# Patient Record
Sex: Female | Born: 1985 | Race: White | Hispanic: No | Marital: Married | Smoking: Never smoker
Health system: Southern US, Community
[De-identification: ages and names within clinical notes are randomized; demographics above are authoritative.]

## PROBLEM LIST (undated history)

## (undated) DIAGNOSIS — G43909 Migraine, unspecified, not intractable, without status migrainosus: Secondary | ICD-10-CM

## (undated) DIAGNOSIS — K589 Irritable bowel syndrome without diarrhea: Secondary | ICD-10-CM

## (undated) DIAGNOSIS — F32A Depression, unspecified: Secondary | ICD-10-CM

## (undated) DIAGNOSIS — G932 Benign intracranial hypertension: Secondary | ICD-10-CM

## (undated) DIAGNOSIS — K219 Gastro-esophageal reflux disease without esophagitis: Secondary | ICD-10-CM

## (undated) DIAGNOSIS — F419 Anxiety disorder, unspecified: Secondary | ICD-10-CM

## (undated) DIAGNOSIS — E785 Hyperlipidemia, unspecified: Secondary | ICD-10-CM

## (undated) HISTORY — PX: CHOLECYSTECTOMY: SHX55

## (undated) HISTORY — PX: WISDOM TOOTH EXTRACTION: SHX21

## (undated) HISTORY — PX: TONSILLECTOMY: SUR1361

## (undated) HISTORY — PX: ABDOMINAL HYSTERECTOMY: SHX81

---

## 2010-09-06 ENCOUNTER — Encounter: Admission: RE | Admit: 2010-09-06 | Discharge: 2010-09-06 | Payer: Self-pay | Admitting: Unknown Physician Specialty

## 2016-04-07 ENCOUNTER — Encounter: Payer: Self-pay | Admitting: *Deleted

## 2016-04-07 ENCOUNTER — Emergency Department
Admission: EM | Admit: 2016-04-07 | Discharge: 2016-04-07 | Disposition: A | Discharge status: Home / Self Care | Payer: BLUE CROSS/BLUE SHIELD | Source: Home / Self Care | Attending: Emergency Medicine | Admitting: Emergency Medicine

## 2016-04-07 DIAGNOSIS — G4459 Other complicated headache syndrome: Secondary | ICD-10-CM

## 2016-04-07 HISTORY — DX: Hyperlipidemia, unspecified: E78.5

## 2016-04-07 HISTORY — DX: Benign intracranial hypertension: G93.2

## 2016-04-07 MED ORDER — PROMETHAZINE HCL 25 MG PO TABS: 25.0000 mg | ORAL_TABLET | Freq: Four times a day (QID) | ORAL | Status: AC | PRN

## 2016-04-07 MED ORDER — PROMETHAZINE HCL 25 MG PO TABS
25.0000 mg | ORAL_TABLET | Freq: Four times a day (QID) | ORAL | Status: DC | PRN
  Administered 2016-04-07: 25 mg via ORAL

## 2016-04-07 MED ORDER — KETOROLAC TROMETHAMINE 60 MG/2ML IM SOLN
60.0000 mg | Freq: Once | INTRAMUSCULAR | Status: AC
  Administered 2016-04-07: 60 mg via INTRAMUSCULAR

## 2016-04-07 NOTE — ED Provider Notes (Signed)
CSN: [id number]     Arrival date & time 04/07/16  1411 History   First MD Initiated Contact with Patient 04/07/16 1506     Chief Complaint  Patient presents with  . Migraine   (Consider location/radiation/quality/duration/timing/severity/associated sxs/prior Treatment) Patient is a 30 y.o. female presenting with migraines. The history is provided by the patient. No language interpreter was used.  Migraine This is a recurrent problem. The current episode started more than 1 week ago. The problem occurs constantly. The problem has been gradually worsening. Pertinent negatives include no headaches and no shortness of breath. Nothing aggravates the symptoms. Nothing relieves the symptoms. She has tried nothing for the symptoms. The treatment provided no relief.  Pt reports she has a history of pseudotumor cerebri. Pt reports headaches are normal.  Pt reports she has felt hot and flushed.  Pt complains of feeling shaky and nervous.  Pt went to ED and had migraine cocktail with some relief.  Pt reports she is worried about thyroid problems. Past Medical History  Diagnosis Date  . Hyperlipidemia   . Pseudotumor cerebri    Past Surgical History  Procedure Laterality Date  . Tonsillectomy    . Wisdom tooth extraction     History reviewed. No pertinent family history. Social History  Substance Use Topics  . Smoking status: Never Smoker   . Smokeless tobacco: None  . Alcohol Use: No   OB History    No data available     Review of Systems  Respiratory: Negative for shortness of breath.   Neurological: Negative for headaches.  All other systems reviewed and are negative.   Allergies  Penicillins  Home Medications   Prior to Admission medications   Medication Sig Start Date End Date Taking? Authorizing Provider  FLUoxetine (PROZAC) 40 MG capsule Take 40 mg by mouth daily.   Yes Historical Provider, MD  furosemide (LASIX) 40 MG tablet Take 40 mg by mouth.   Yes Historical Provider, MD   LORazepam (ATIVAN) 0.5 MG tablet Take 0.5 mg by mouth every 8 (eight) hours.   Yes Historical Provider, MD   Meds Ordered and Administered this Visit   Medications  ketorolac (TORADOL) injection 60 mg (60 mg Intramuscular Given 04/07/16 1507)    BP 125/88 mmHg  Pulse 90  Temp(Src) 98.4 F (36.9 C) (Oral)  Resp 16  Ht  (1.778 m)  Wt 205 lb (92.987 kg)  BMI 29.41 kg/m2  SpO2 97%  LMP 03/08/2016 No data found.   Physical Exam  Constitutional: She is oriented to person, place, and time. She appears well-developed and well-nourished.  HENT:  Head: Normocephalic and atraumatic.  Right Ear: External ear normal.  Left Ear: External ear normal.  Nose: Nose normal.  Mouth/Throat: Oropharynx is clear and moist.  Eyes: Conjunctivae and EOM are normal. Pupils are equal, round, and reactive to light.  Neck: Normal range of motion.  Cardiovascular: Normal rate and normal heart sounds.   Pulmonary/Chest: Effort normal and breath sounds normal.  Abdominal: Soft. She exhibits no distension.  Musculoskeletal: Normal range of motion.  Neurological: She is alert and oriented to person, place, and time.  Skin: Skin is warm.  Psychiatric: She has a normal mood and affect.  Nursing note and vitals reviewed.   ED Course  Procedures (including critical care time)  Labs Review Labs Reviewed  CBC WITH DIFFERENTIAL/PLATELET  COMPREHENSIVE METABOLIC PANEL  TSH    Imaging Review No results found.   Visual Acuity Review  Right Eye  Distance:   Left Eye Distance:   IBilateral Distance:    Right Eye Near:   Left Eye Near:    Bilateral Near:         MDM  Pt given torodol Im and phenergan po.  Pt has normal exam.   I sill send labs due to pt's concern about thyroid disease and her hot flashes and shaking. I will send labs.  Pt is advised results will not return until later today/tomorrow.  Pt is advised to follow up with her neurologist.  Labs reviewed.  Normal tsh.     1.  Other complicated headache syndrome    An After Visit Summary was printed and given to the patient. Meds ordered this encounter  Medications  . furosemide (LASIX) 40 MG tablet    Sig: Take 40 mg by mouth.  Marland Kitchen. FLUoxetine (PROZAC) 40 MG capsule    Sig: Take 40 mg by mouth daily.  Marland Kitchen. LORazepam (ATIVAN) 0.5 MG tablet    Sig: Take 0.5 mg by mouth every 8 (eight) hours.  Marland Kitchen. DISCONTD: promethazine (PHENERGAN) tablet 25 mg    Sig:   . ketorolac (TORADOL) injection 60 mg    Sig:   . promethazine (PHENERGAN) 25 MG tablet    Sig: Take 1 tablet (25 mg total) by mouth every 6 (six) hours as needed for nausea or vomiting.    Dispense:  20 tablet    Refill:  0    Order Specific Question:  Supervising Provider    Answer:  Georgina PillionMASSEY, DAVID [[id number]]    Elson AreasLeslie K Shayley Medlin, PA-C 04/07/16 [address]1535  Deisha Stull K Pierrepont ManorSofia, [place] JerseyPA-C 04/08/16 [phone]

## 2016-04-07 NOTE — ED Notes (Signed)
Pt c/o migraine with nausea x 8 days. She went to the ED on 04/02/2016. Reports still having HA's everyday. She has a hx of Pseudotumor cerebri.

## 2016-04-07 NOTE — Discharge Instructions (Signed)

## 2016-04-08 ENCOUNTER — Telehealth: Payer: Self-pay | Admitting: *Deleted

## 2016-04-08 LAB — CBC WITH DIFFERENTIAL/PLATELET
Basophils Absolute: 0 cells/uL (ref 0–200)
Basophils Relative: 0 %
EOS PCT: 3 %
Eosinophils Absolute: 324 cells/uL (ref 15–500)
HCT: 47.3 % — ABNORMAL HIGH (ref 35.0–45.0)
HEMOGLOBIN: 15.9 g/dL — AB (ref 11.7–15.5)
LYMPHS ABS: 3456 {cells}/uL (ref 850–3900)
LYMPHS PCT: 32 %
MCH: 30.8 pg (ref 27.0–33.0)
MCHC: 33.6 g/dL (ref 32.0–36.0)
MCV: 91.7 fL (ref 80.0–100.0)
MONOS PCT: 5 %
MPV: 10.1 fL (ref 7.5–12.5)
Monocytes Absolute: 540 cells/uL (ref 200–950)
NEUTROS PCT: 60 %
Neutro Abs: 6480 cells/uL (ref 1500–7800)
PLATELETS: 371 10*3/uL (ref 140–400)
RBC: 5.16 MIL/uL — AB (ref 3.80–5.10)
RDW: 13.5 % (ref 11.0–15.0)
WBC: 10.8 10*3/uL (ref 3.8–10.8)

## 2016-04-08 LAB — COMPREHENSIVE METABOLIC PANEL
ALBUMIN: 4.6 g/dL (ref 3.6–5.1)
ALK PHOS: 79 U/L (ref 33–115)
ALT: 16 U/L (ref 6–29)
AST: 12 U/L (ref 10–30)
BUN: 15 mg/dL (ref 7–25)
CALCIUM: 10 mg/dL (ref 8.6–10.2)
CHLORIDE: 101 mmol/L (ref 98–110)
CO2: 24 mmol/L (ref 20–31)
Creat: 0.8 mg/dL (ref 0.50–1.10)
Glucose, Bld: 79 mg/dL (ref 65–99)
POTASSIUM: 4.4 mmol/L (ref 3.5–5.3)
SODIUM: 137 mmol/L (ref 135–146)
TOTAL PROTEIN: 7.2 g/dL (ref 6.1–8.1)
Total Bilirubin: 0.4 mg/dL (ref 0.2–1.2)

## 2016-04-08 LAB — TSH: TSH: 3.33 m[IU]/L

## 2016-06-22 ENCOUNTER — Encounter: Payer: Self-pay | Admitting: Emergency Medicine

## 2016-06-22 ENCOUNTER — Emergency Department
Admission: EM | Admit: 2016-06-22 | Discharge: 2016-06-22 | Disposition: A | Discharge status: Home / Self Care | Payer: BLUE CROSS/BLUE SHIELD | Source: Home / Self Care | Attending: Family Medicine | Admitting: Family Medicine

## 2016-06-22 DIAGNOSIS — R51 Headache: Secondary | ICD-10-CM

## 2016-06-22 DIAGNOSIS — R519 Headache, unspecified: Secondary | ICD-10-CM

## 2016-06-22 MED ORDER — KETOROLAC TROMETHAMINE 60 MG/2ML IM SOLN
60.0000 mg | Freq: Once | INTRAMUSCULAR | Status: AC
  Administered 2016-06-22: 60 mg via INTRAMUSCULAR

## 2016-06-22 MED ORDER — FLUTICASONE PROPIONATE 50 MCG/ACT NA SUSP: 2.0000 | Freq: Every day | NASAL | Status: AC

## 2016-06-22 MED ORDER — NAPROXEN 500 MG PO TABS: 500.0000 mg | ORAL_TABLET | Freq: Two times a day (BID) | ORAL | Status: AC

## 2016-06-22 NOTE — ED Provider Notes (Signed)
CSN: [id number]     Arrival date & time 06/22/16  1752 History   First MD Initiated Contact with Patient 06/22/16 1800     Chief Complaint  Patient presents with  . Headache   (Consider location/radiation/quality/duration/timing/severity/associated sxs/prior Treatment) HPI Larry SierrasLauren Faulkner is a 30 y.o. female presenting to UC with hx of pseudotumor cerebri and migraines, [facility] 3 days of gradually worsening frontal headache pt concerned is a sinus infection. Pain is aching and sore across bridge of her nose and behind her eyes, 8/10 at worst. She has been taking her migraine medications- Maxalt and Propranonol w/o relief.  She has also stayed home from work and rested in a dark room w/o relief.  She has f/u with her eye doctor and PCP. her PCP prescribed her prednisone, which she took first dose earlier today.  She notes eye doctor checked the pressure in her eyes and it was normal.  She has not taken acetaminophen, as she states "I'm not allowed to take caffeine because my pseudotumor," or ibuprofen. Denies nausea, fever, chills, or congestion.  Denies recent head trauma. Denies change in vision, neck pain or stiffness.    Past Medical History  Diagnosis Date  . Hyperlipidemia   . Pseudotumor cerebri    Past Surgical History  Procedure Laterality Date  . Tonsillectomy    . Wisdom tooth extraction     History reviewed. No pertinent family history. Social History  Substance Use Topics  . Smoking status: Never Smoker   . Smokeless tobacco: None  . Alcohol Use: No   OB History    No data available     Review of Systems  Constitutional: Negative for fever and chills.  HENT: Positive for sinus pressure. Negative for congestion, ear pain, rhinorrhea, sore throat, trouble swallowing and voice change.   Eyes: Positive for pain ("headache behind eyes"). Negative for photophobia.  Respiratory: Negative for cough and shortness of breath.   Cardiovascular: Negative for  chest pain and palpitations.  Gastrointestinal: Negative for nausea, vomiting, abdominal pain and diarrhea.  Musculoskeletal: Negative for myalgias, back pain and arthralgias.  Skin: Negative for rash.  Neurological: Positive for headaches. Negative for dizziness and light-headedness.    Allergies  Penicillins  Home Medications   Prior to Admission medications   Medication Sig Start Date End Date Taking? Authorizing Provider  FLUoxetine (PROZAC) 40 MG capsule Take 40 mg by mouth daily.    Historical Provider, MD  fluticasone (FLONASE) 50 MCG/ACT nasal spray Place 2 sprays into both nostrils daily. For at least 2 weeks, then daily as needed 06/22/16   Junius FinnerErin O'Malley, PA-C  furosemide (LASIX) 40 MG tablet Take 40 mg by mouth.    Historical Provider, MD  LORazepam (ATIVAN) 0.5 MG tablet Take 0.5 mg by mouth every 8 (eight) hours.    Historical Provider, MD  naproxen (NAPROSYN) 500 MG tablet Take 1 tablet (500 mg total) by mouth 2 (two) times daily. 06/22/16   Junius FinnerErin O'Malley, PA-C  promethazine (PHENERGAN) 25 MG tablet Take 1 tablet (25 mg total) by mouth every 6 (six) hours as needed for nausea or vomiting. 04/07/16   Elson AreasLeslie K Sofia, PA-C   Meds Ordered and Administered this Visit   Medications  ketorolac (TORADOL) injection 60 mg (60 mg Intramuscular Given 06/22/16 1813)    BP 112/79 mmHg  Pulse 78  Temp(Src) 97.8 F (36.6 C) (Oral)  Wt 209 lb (94.802 kg)  SpO2 98%  LMP 06/13/2016 No data found.  Physical Exam  Constitutional: She is oriented to person, place, and time. She appears well-developed and well-nourished. No distress.  HENT:  Head: Normocephalic and atraumatic.  Right Ear: Tympanic membrane normal.  Left Ear: Tympanic membrane normal.  Nose: Nose normal.  Mouth/Throat: Uvula is midline, oropharynx is clear and moist and mucous membranes are normal.  Eyes: EOM are normal. Pupils are equal, round, and reactive to light.  Neck: Normal range of motion.  Cardiovascular:  Normal rate, regular rhythm and normal heart sounds.   Pulmonary/Chest: Effort normal and breath sounds normal. No respiratory distress.  Musculoskeletal: Normal range of motion.  Neurological: She is alert and oriented to person, place, and time. She has normal strength. No cranial nerve deficit or sensory deficit. Coordination and gait normal. GCS eye subscore is 4. GCS verbal subscore is 5. GCS motor subscore is 6.  Skin: Skin is warm and dry.  Psychiatric: She has a normal mood and affect. Her behavior is normal.  Nursing note and vitals reviewed.   ED Course  Procedures (including critical care time)  Labs Review Labs Reviewed - No data to display  Imaging Review No results found.    MDM   1. Sinus headache    Pt c/o sinus headache. She already had eye pressure checked earlier today. Started on prednisone earlier today. Denies nausea. Normal neuro exam. Denies fever or nasal congestion. Doubt sinus infection at this time.  Tx: Toradol  IM Rx: Naproxen and Flonase (to use for at least 2 weeks, then daily as needed) Encouraged f/u with PCP if not improving in 2-3 days. Discussed symptoms that warrant emergent care in the ED. Patient verbalized understanding and agreement with treatment plan.     Junius Finner, PA-C 06/22/16 1821

## 2016-06-22 NOTE — ED Notes (Signed)
Pt c/o facial pain and HA x3 days. Migraine meds not helping.

## 2016-07-01 ENCOUNTER — Emergency Department
Admission: EM | Admit: 2016-07-01 | Discharge: 2016-07-01 | Disposition: A | Discharge status: Other Acute Inpatient Hospital | Payer: BLUE CROSS/BLUE SHIELD | Source: Home / Self Care | Attending: Family Medicine | Admitting: Family Medicine

## 2016-07-01 ENCOUNTER — Encounter: Payer: Self-pay | Admitting: Emergency Medicine

## 2016-07-01 DIAGNOSIS — R110 Nausea: Secondary | ICD-10-CM

## 2016-07-01 DIAGNOSIS — R1010 Upper abdominal pain, unspecified: Secondary | ICD-10-CM | POA: Diagnosis not present

## 2016-07-01 MED ORDER — PROMETHAZINE HCL 25 MG PO TABS
25.0000 mg | ORAL_TABLET | Freq: Once | ORAL | Status: AC
  Administered 2016-07-01: 25 mg via ORAL

## 2016-07-01 NOTE — ED Provider Notes (Signed)
CSN: [id number]     Arrival date & time 07/01/16  1413 History   First MD Initiated Contact with Patient 07/01/16 1444     Chief Complaint  Patient presents with  . Abdominal Pain   (Consider location/radiation/quality/duration/timing/severity/associated sxs/prior Treatment) HPI  Michele Day is a 30 y.o. female presenting to UC with c/o epigastric and upper abdominal pain that started last night, waxes and wanes throughout the day, 3/10 at worst, cramping and occasionally sharp. She has associated nausea, dry mouth, and mildly lightheaded.  Pt notes it feels like a hunger pain but states she has had a large sandwich and drink for lunch but still feels "hungry."  She notes a co-worker caledl out yesterday due to vomiting but she also notes she had an increase in her lasix medication for her pseudotumor cerebri and wonders if that is the cause of her symptoms.  Denies vomiting or diarrhea. Hx of acid reflux as a teenager and notes she has had increased stress recently.  Denies hx of abdominal surgeries. No urinary symptoms. Denies back or chest pain.    Past Medical History:  Diagnosis Date  . Hyperlipidemia   . Pseudotumor cerebri    Past Surgical History:  Procedure Laterality Date  . TONSILLECTOMY    . WISDOM TOOTH EXTRACTION     History reviewed. No pertinent family history. Social History  Substance Use Topics  . Smoking status: Never Smoker  . Smokeless tobacco: Never Used  . Alcohol use No   OB History    No data available     Review of Systems  Constitutional: Negative for appetite change, chills, diaphoresis, fatigue and fever.  Respiratory: Negative for cough and shortness of breath.   Cardiovascular: Negative for chest pain, palpitations and leg swelling.  Gastrointestinal: Positive for abdominal pain and nausea. Negative for constipation, diarrhea and vomiting.  Genitourinary: Negative for dysuria, flank pain, frequency and hematuria.  Musculoskeletal: Negative  for arthralgias and myalgias.  Neurological: Positive for light-headedness. Negative for dizziness and headaches.    Allergies  Penicillins  Home Medications   Prior to Admission medications   Medication Sig Start Date End Date Taking? Authorizing Provider  FLUoxetine (PROZAC) 40 MG capsule Take 40 mg by mouth daily.    Historical Provider, MD  fluticasone (FLONASE) 50 MCG/ACT nasal spray Place 2 sprays into both nostrils daily. For at least 2 weeks, then daily as needed 06/22/16   Junius Finner, PA-C  furosemide (LASIX) 40 MG tablet Take 40 mg by mouth 2 (two) times daily.     Historical Provider, MD  LORazepam (ATIVAN) 0.5 MG tablet Take 0.5 mg by mouth every 8 (eight) hours.    Historical Provider, MD  naproxen (NAPROSYN) 500 MG tablet Take 1 tablet (500 mg total) by mouth 2 (two) times daily. 06/22/16   Junius Finner, PA-C  promethazine (PHENERGAN) 25 MG tablet Take 1 tablet (25 mg total) by mouth every 6 (six) hours as needed for nausea or vomiting. 04/07/16   Elson Areas, PA-C   Meds Ordered and Administered this Visit   Medications  promethazine (PHENERGAN) tablet 25 mg (25 mg Oral Given 07/01/16 1453)    BP 109/75 (BP Location: Left Arm)   Pulse 63   Temp 98.1 F (36.7 C) (Oral)   Resp 16   Ht 5\' 10"  (1.778 m)   Wt 210 lb (95.3 kg)   LMP 06/14/2016 (Exact Date)   SpO2 96%   BMI 30.13 kg/m  Orthostatic VS for the past 24  hrs:  BP- Lying Pulse- Lying BP- Sitting Pulse- Sitting BP- Standing at 0 minutes Pulse- Standing at 0 minutes  07/01/16 1512 113/75 68 111/77 68 110/77 64    Physical Exam  Constitutional: She is oriented to person, place, and time. She appears well-developed and well-nourished.  Pt sitting comfortably on exam bed, NAD  HENT:  Head: Normocephalic and atraumatic.  Eyes: EOM are normal. Pupils are equal, round, and reactive to light.  Neck: Normal range of motion.  Cardiovascular: Normal rate and regular rhythm.   Pulmonary/Chest: Effort normal. No  respiratory distress. She has no wheezes. She has no rales.  Abdominal: Soft. Bowel sounds are normal. She exhibits no distension and no mass. There is tenderness. There is no rebound and no guarding. No hernia.  Musculoskeletal: Normal range of motion.  Neurological: She is alert and oriented to person, place, and time.  Skin: Skin is warm and dry.  Psychiatric: She has a normal mood and affect. Her behavior is normal.  Nursing note and vitals reviewed.   Urgent Care Course   Clinical Course    Procedures (including critical care time)  Labs Review Labs Reviewed - No data to display  Imaging Review No results found.   MDM   1. Pain of upper abdomen   2. Nausea    Pt c/o upper abdominal pain worse in epigastrium and LUQ.  Tenderness on exam w/o rebound, guarding or masses. Pt recently increased dose of Lasix but also notes a co-worked called out b/c of vomiting yesterday.  Pt has also been under a lot of stress recently.  DDx: pancreatitis or GI upset from lasix increase, cholelithiasis (low concern for cholecystitis due to lack of fever or vomiting), viral gastritis or gastritis from acid reflux/ulcers.  Pt given Phenergan in UC. No vomiting while in UC. Moist mucous membranes. Normal vitals. Attempted to obtain blood labs, however, nursing staff unable to obtain blood.  Discussed symptomatic treatment, watch/wait vs going to emergency department this afternoon for further evaluation. Pt plans to go to emergency department for stat labs and potential imaging. Pt stable for discharge and transport to ED via POV with family member.     Junius Finner, PA-C 07/01/16 1525

## 2016-07-01 NOTE — ED Triage Notes (Signed)
Patient reports increasing her Lasix dose for past week per neurologist's instructions; last night she began feeling nauseated, dry mouth, light headed and having epigastric pain bilaterally.

## 2017-06-09 ENCOUNTER — Emergency Department (HOSPITAL_COMMUNITY): Payer: BLUE CROSS/BLUE SHIELD

## 2017-06-09 ENCOUNTER — Emergency Department (HOSPITAL_COMMUNITY)
Admission: EM | Admit: 2017-06-09 | Discharge: 2017-06-10 | Disposition: A | Discharge status: Home / Self Care | Payer: BLUE CROSS/BLUE SHIELD | Attending: Emergency Medicine | Admitting: Emergency Medicine

## 2017-06-09 ENCOUNTER — Encounter (HOSPITAL_COMMUNITY): Payer: Self-pay | Admitting: Emergency Medicine

## 2017-06-09 DIAGNOSIS — R1010 Upper abdominal pain, unspecified: Secondary | ICD-10-CM

## 2017-06-09 DIAGNOSIS — R197 Diarrhea, unspecified: Secondary | ICD-10-CM

## 2017-06-09 DIAGNOSIS — R110 Nausea: Secondary | ICD-10-CM

## 2017-06-09 LAB — COMPREHENSIVE METABOLIC PANEL
ALK PHOS: 38 U/L (ref 38–126)
ALT: 18 U/L (ref 14–54)
AST: 16 U/L (ref 15–41)
Albumin: 2.1 g/dL — ABNORMAL LOW (ref 3.5–5.0)
Anion gap: 7 (ref 5–15)
BILIRUBIN TOTAL: 0.4 mg/dL (ref 0.3–1.2)
CALCIUM: 4.9 mg/dL — AB (ref 8.9–10.3)
CHLORIDE: 125 mmol/L — AB (ref 101–111)
CO2: 12 mmol/L — ABNORMAL LOW (ref 22–32)
CREATININE: 0.31 mg/dL — AB (ref 0.44–1.00)
Glucose, Bld: 59 mg/dL — ABNORMAL LOW (ref 65–99)
Potassium: 2.5 mmol/L — CL (ref 3.5–5.1)
Sodium: 144 mmol/L (ref 135–145)
TOTAL PROTEIN: 3.4 g/dL — AB (ref 6.5–8.1)

## 2017-06-09 LAB — URINALYSIS, ROUTINE W REFLEX MICROSCOPIC
Bacteria, UA: NONE SEEN
Bilirubin Urine: NEGATIVE
GLUCOSE, UA: NEGATIVE mg/dL
Ketones, ur: NEGATIVE mg/dL
Leukocytes, UA: NEGATIVE
Nitrite: NEGATIVE
PH: 7 (ref 5.0–8.0)
PROTEIN: NEGATIVE mg/dL
RBC / HPF: NONE SEEN RBC/hpf (ref 0–5)
SPECIFIC GRAVITY, URINE: 1.001 — AB (ref 1.005–1.030)
SQUAMOUS EPITHELIAL / LPF: NONE SEEN

## 2017-06-09 LAB — CBC WITH DIFFERENTIAL/PLATELET
BASOS ABS: 0 10*3/uL (ref 0.0–0.1)
Basophils Relative: 1 %
EOS PCT: 6 %
Eosinophils Absolute: 0.2 10*3/uL (ref 0.0–0.7)
HEMATOCRIT: 24.4 % — AB (ref 36.0–46.0)
Hemoglobin: 8.3 g/dL — ABNORMAL LOW (ref 12.0–15.0)
LYMPHS ABS: 1.3 10*3/uL (ref 0.7–4.0)
LYMPHS PCT: 32 %
MCH: 30.9 pg (ref 26.0–34.0)
MCHC: 34 g/dL (ref 30.0–36.0)
MCV: 90.7 fL (ref 78.0–100.0)
MONO ABS: 0.2 10*3/uL (ref 0.1–1.0)
Monocytes Relative: 6 %
NEUTROS ABS: 2.2 10*3/uL (ref 1.7–7.7)
Neutrophils Relative %: 55 %
PLATELETS: 151 10*3/uL (ref 150–400)
RBC: 2.69 MIL/uL — AB (ref 3.87–5.11)
RDW: 12.3 % (ref 11.5–15.5)
WBC: 4 10*3/uL (ref 4.0–10.5)

## 2017-06-09 LAB — LIPASE, BLOOD: LIPASE: 13 U/L (ref 11–51)

## 2017-06-09 MED ORDER — ONDANSETRON HCL 4 MG/2ML IJ SOLN
4.0000 mg | Freq: Once | INTRAMUSCULAR | Status: AC
  Administered 2017-06-09: 4 mg via INTRAVENOUS
  Filled 2017-06-09: qty 2

## 2017-06-09 MED ORDER — SODIUM CHLORIDE 0.9 % IV BOLUS (SEPSIS)
1000.0000 mL | Freq: Once | INTRAVENOUS | Status: AC
Start: 2017-06-09 — End: 2017-06-10
  Administered 2017-06-09: 1000 mL via INTRAVENOUS

## 2017-06-09 MED ORDER — LORAZEPAM 2 MG/ML IJ SOLN
1.0000 mg | Freq: Once | INTRAMUSCULAR | Status: AC
  Administered 2017-06-09: 1 mg via INTRAVENOUS
  Filled 2017-06-09: qty 1

## 2017-06-09 MED ORDER — MORPHINE SULFATE (PF) 2 MG/ML IV SOLN
4.0000 mg | Freq: Once | INTRAVENOUS | Status: AC
  Administered 2017-06-09: 4 mg via INTRAVENOUS
  Filled 2017-06-09: qty 2

## 2017-06-09 MED ORDER — POTASSIUM CHLORIDE CRYS ER 20 MEQ PO TBCR
40.0000 meq | EXTENDED_RELEASE_TABLET | Freq: Once | ORAL | Status: AC
  Administered 2017-06-10: 40 meq via ORAL
  Filled 2017-06-09: qty 2

## 2017-06-09 NOTE — ED Provider Notes (Signed)
[facility]-EMERGENCY DEPT Provider Note   CSN: [id number] Arrival date & time: 06/09/17  1914     History   Chief Complaint Chief Complaint  Patient presents with  . Allergic Reaction    HPI Michele Day is a 31 y.o. female with a PMHx of pseudotumor cerebri, IBS, nephrolithiasis, HLD, GERD, and bipolar disorder, who presents to the ED with a myriad of complaints, some of which are unrelated to each other. Patient is a difficult historian which complicates the evaluation just slightly, but ultimately after long history, reason for ED visit is able to be summarized. Essentially, patient states that on 06/05/17 she went to [facility] specialists (saw Dr. Yevonne Pax per chart review) for evaluation of some ongoing upper abdominal pain, nausea, and constipation that had been going on for the last several weeks (of note, she was in a psychiatric facility the week before and they didn't send her for eval at that time, told her to go see someone after she left, which is why she made the appt on 06/05/17 for eval/consult of her symptoms). She reports that they gave her "a colon cleanse and Dulcolax" and started her on Linzess, and obtained a CT scan of her abdomen with contrast which was done yesterday. She has not received the results of that CT scan. She reports that since starting the colon cleanse she has had diarrhea, worsening abdominal pain, ongoing nausea, and has developed chills. She is also having increased belching and increased urinary frequency today. She describes her abdominal pain as 8/10 intermittent squeezing upper abdominal pain that occasionally radiates to her back, worse with eating, and unimproved with Linzess. No other medications tried for her pain. She states the pain today is the same abd pain she was seen for at the GI doctor's office on 06/05/17, however it feels worse since the colon cleanse and starting Linzess.   Then around 6-7 PM today she started feeling shaky, panicky/anxious  feeling, and felt like her throat was dry so she was having trouble swallowing, and felt like she was having an anxiety attack; states that once she arrived here and has been given a warm blanket, the symptoms have resolved. (However this is why the triage report stated she was here for "allergic reaction"; pt denies that she had an allergic reaction, states she just had a panic attack). Of note, she was started on gabapentin and lamictal 1.5wks ago at the psychiatric center, and since then she's had "shocking feelings" in the back of her neck that come and go; however this was ongoing there and is NOT an acute complaint.   She last ate fish sandwich at ~2:30pm. Reports +NSAID use recently. Currently on menstrual cycle. No prior abd surgeries. She denies recent travel, sick contacts, suspicious food intake, or EtOH use.   She also denies fevers, face swelling, allergic reaction symptoms, ongoing shaking/difficulty swallowing sensation, CP, SOB, vomiting, constipation, obstipation, melena, hematochezia, hematuria, dysuria, vaginal discharge, myalgias, arthralgias, numbness, tingling, focal weakness, or any other complaints at this time.   Of note, chart review of GI notes in care everywhere reveals that they stated that she had been seen at the GI doctor's office in 2016, and then hadn't been back since then, until that day's visit; during that visit she was endorsing globus sensation x1 wk as well as upper abd pain/nausea and constipation after a bout of pain meds recently, but that it had resolved with senakot and dulcolax; and she also endorsed diarrhea symptoms during that visit, but  reported that they had resolved and then had return of constipation; she admitted that she had stopped her PPI about 54yr ago. They also report that she had an EGD in 2012 that showed gastritis/esophagitis. They recommended miralax colon cleanse and started linzess, and ordered an abd xray which was negative, and then it  appears they ordered an abd/pelv CT (although no mention of this in their notes).    The history is provided by the patient and medical records. No language interpreter was used.  Abdominal Pain   This is a recurrent problem. The current episode started more than 1 week ago. The problem occurs daily. The problem has been gradually worsening. The pain is associated with eating. The pain is located in the LUQ and epigastric region. Quality: squeezing. The pain is at a severity of 8/10. The pain is moderate. Associated symptoms include belching, diarrhea, nausea and frequency. Pertinent negatives include fever, flatus, hematochezia, melena, vomiting, constipation, dysuria, hematuria, arthralgias and myalgias. The symptoms are aggravated by eating. Nothing relieves the symptoms. Past workup includes GI consult and CT scan. Her past medical history is significant for gallstones, GERD and irritable bowel syndrome.  Diarrhea   Associated symptoms include abdominal pain and chills. Pertinent negatives include no vomiting, no arthralgias and no myalgias. Her past medical history is significant for irritable bowel syndrome.    Past Medical History:  Diagnosis Date  . Hyperlipidemia   . Pseudotumor cerebri     There are no active problems to display for this patient.   Past Surgical History:  Procedure Laterality Date  . TONSILLECTOMY    . WISDOM TOOTH EXTRACTION      OB History    No data available       Home Medications    Prior to Admission medications   Medication Sig Start Date End Date Taking? Authorizing Provider  bisacodyl (DULCOLAX) 5 MG EC tablet Take 10 mg by mouth once.   Yes [provider]  carisoprodol (SOMA) 350 MG tablet Take 350 mg by mouth at bedtime. 05/01/17  Yes [provider]  cyclobenzaprine (FLEXERIL) 10 MG tablet Take 5 mg by mouth daily as needed for spasms. 05/02/17  Yes [provider]  furosemide (LASIX) 40 MG tablet Take 40 mg by  mouth daily.    Yes [provider]  gabapentin (NEURONTIN) 100 MG capsule Take 100 mg by mouth 2 (two) times daily. 100 mg in am, 100 mg noon. 06/02/17  Yes [provider]  gabapentin (NEURONTIN) 300 MG capsule Take 300 mg by mouth at bedtime. 06/02/17  Yes [provider]  HYDROcodone-acetaminophen (NORCO/VICODIN) 5-325 MG tablet Take 1 tablet by mouth daily as needed for pain. 05/22/17  Yes [provider]  lamoTRIgine (LAMICTAL) 25 MG tablet Take 25 mg by mouth daily. 06/02/17  Yes [provider]  LINZESS 145 MCG CAPS capsule Take 145 mcg by mouth daily. 06/05/17  Yes [provider]  LORazepam (ATIVAN) 0.5 MG tablet Take 0.5 mg by mouth at bedtime.    Yes [provider]  medroxyPROGESTERone (PROVERA) 10 MG tablet Take 10 mg by mouth as directed. Take 10 mg 15-25 of each month 05/17/17  Yes [provider]  omeprazole (PRILOSEC) 40 MG capsule Take 40 mg by mouth daily. 06/04/17  Yes [provider]  polyethylene glycol powder (GLYCOLAX/MIRALAX) powder Take 17 g by mouth daily as needed for constipation. 06/05/17  Yes [provider]  propranolol ER (INDERAL LA) 80 MG 24 hr capsule  Take 80 mg by mouth daily. 06/07/17  Yes [provider]  rizatriptan (MAXALT) 10 MG tablet Take 10 mg by mouth as needed for migraine. 05/19/16  Yes [provider]  zolpidem (AMBIEN) 10 MG tablet Take 10 mg by mouth at bedtime. 06/02/17  Yes [provider]  fluticasone (FLONASE) 50 MCG/ACT nasal spray Place 2 sprays into both nostrils daily. For at least 2 weeks, then daily as needed Patient not taking: Reported on 06/09/2017 06/22/16   Noe Gens, PA-C  naproxen (NAPROSYN) 500 MG tablet Take 1 tablet (500 mg total) by mouth 2 (two) times daily. Patient not taking: Reported on 06/09/2017 06/22/16   Noe Gens, PA-C  promethazine (PHENERGAN) 25 MG tablet Take 1 tablet (25 mg total) by mouth every 6 (six) hours  as needed for nausea or vomiting. Patient not taking: Reported on 06/09/2017 04/07/16   Sidney Ace    Family History History reviewed. No pertinent family history.  Social History Social History  Substance Use Topics  . Smoking status: Never Smoker  . Smokeless tobacco: Never Used  . Alcohol use No     Allergies   Ciprofloxacin; Latex; Doxycycline; Duloxetine; and Penicillins   Review of Systems Review of Systems  Constitutional: Positive for chills. Negative for fever.  HENT: Negative for facial swelling and trouble swallowing (none ongoing).   Respiratory: Negative for shortness of breath.   Cardiovascular: Negative for chest pain.  Gastrointestinal: Positive for abdominal pain, diarrhea and nausea. Negative for blood in stool, constipation, flatus, hematochezia, melena and vomiting.       +belching  Genitourinary: Positive for frequency. Negative for dysuria, hematuria and vaginal discharge.  Musculoskeletal: Negative for arthralgias and myalgias.  Skin: Negative for color change.  Allergic/Immunologic: Negative for immunocompromised state.  Neurological: Negative for weakness and numbness.  Psychiatric/Behavioral: Negative for confusion. The patient is nervous/anxious.    All other systems reviewed and are negative for acute change except as noted in the HPI.    Physical Exam Updated Vital Signs BP (!) 162/94 (BP Location: Right Arm)   Pulse 64   Temp 97.6 F (36.4 C) (Oral)   Resp 18   Ht _0  (1.753 m)   Wt 97.1 kg (214 lb)   LMP 06/09/2017   SpO2 100%   BMI 31.60 kg/m   Physical Exam  Constitutional: She is oriented to person, place, and time. Vital signs are normal. She appears well-developed and well-nourished.  Non-toxic appearance. No distress.  Afebrile, nontoxic, NAD except for anxious appearing  HENT:  Head: Normocephalic and atraumatic.  Mouth/Throat: Oropharynx is clear and moist and mucous membranes are normal.  Eyes: Conjunctivae and  EOM are normal. Right eye exhibits no discharge. Left eye exhibits no discharge.  Neck: Normal range of motion. Neck supple.  Cardiovascular: Normal rate, regular rhythm, normal heart sounds and intact distal pulses.  Exam reveals no gallop and no friction rub.   No murmur heard. Pulmonary/Chest: Effort normal and breath sounds normal. No respiratory distress. She has no decreased breath sounds. She has no wheezes. She has no rhonchi. She has no rales.  Abdominal: Soft. Normal appearance and bowel sounds are normal. She exhibits no distension. There is tenderness in the right upper quadrant, epigastric area and left upper quadrant. There is positive Murphy's sign. There is no rigidity, no rebound, no guarding, no CVA tenderness and no tenderness at McBurney's point.  Soft, obese but nondistended, +BS throughout, with mild upper abd TTP across entire  upper abdomen, no r/g/r, +murphy's, neg mcburney's, no CVA TTP   Musculoskeletal: Normal range of motion.  Neurological: She is alert and oriented to person, place, and time. She has normal strength. No sensory deficit.  Skin: Skin is warm, dry and intact. No rash noted.  Psychiatric: Her mood appears anxious.  Anxious appearing  Nursing note and vitals reviewed.    ED Treatments / Results  Labs (all labs ordered are listed, but only abnormal results are displayed) Labs Reviewed  CBC WITH DIFFERENTIAL/PLATELET - Abnormal; Notable for the following:       Result Value   RBC 2.69 (*)    Hemoglobin 8.3 (*)    HCT 24.4 (*)    All other components within normal limits  URINALYSIS, ROUTINE W REFLEX MICROSCOPIC - Abnormal; Notable for the following:    Color, Urine COLORLESS (*)    Specific Gravity, Urine 1.001 (*)    Hgb urine dipstick LARGE (*)    All other components within normal limits  COMPREHENSIVE METABOLIC PANEL  LIPASE, BLOOD  I-STAT BETA HCG BLOOD, ED ([facility], [facility], [facility] ONLY)  I-STAT TROPOININ, ED    EKG  EKG  Interpretation  Date/Time:  Saturday June 09 2017 20:45:02 EDT Ventricular Rate:  64 PR Interval:    QRS Duration: 80 QT Interval:  410 QTC Calculation: 423 R Axis:   55 Text Interpretation:  Sinus rhythm Probable left atrial enlargement Confirmed by Lita Mains  MD, DAVID ([id number]) on 06/09/2017 10:37:06 PM       Radiology No results found.   CT Abdomen Pelvis W IV Contrast 06/08/2017 [facility] Result Impression  IMPRESSION: 1.Gastric/duodenal distention with isodense material. No signs of outlet obstruction. 2.Postinflammatory fatty deposition throughout the colon without acute inflammatory change. 3.Gallbladder sludge and probable small stones     Electronically Signed by: Tomma Lightning  Result Narrative  INDICATION: Abdominal pain, unspecified. COMPARISON:11/10/2015 TECHNIQUE:CT ABDOMEN PELVIS W IV CONTRAST - 80 mL Isovue 370 were administered intravenouslyRadiation dose reduction was utilized (automated exposure control, mA or kV adjustment based on patient size, or iterative image reconstruction).  FINDINGS:   VISUALIZED LOWER THORAX: No acute abnormalities.  SOLID VISCERA: Liver: No mass or biliary dilatation. Gallbladder: Sludge and tiny stones in the gallbladder. No wall thickening or pericholecystic fluid Pancreas: No mass or inflammation. Adrenal glands: No suspicious nodules Spleen: Unremarkable spleen and adjacent splenule Kidneys: No stones or hydronephrosis.  GI: Fatty deposition in the wall of the colon suggest prior inflammatory process. No current pericolonic stranding or surrounding inflammatory change to suggest acute colitis. No bowel obstruction or abnormal bowel wall thickening. Low isodense material  filling most the stomach and duodenum, there is oral contrast noted along the nondependent portion of the stomach. No gastric outlet obstruction. Normal appendix.   PERITONEAL CAVITY/RETROPERITONEUM: No free fluid. No  pneumoperitoneum. No lymphadenopathy. Aorta, IVC, iliac arteries, and major visceral arteries are grossly normal.  PELVIS: No acute abnormalities.  MUSCULOSKELETAL: No acute or destructive osseous processes.  MISC: N/A.     Procedures Procedures (including critical care time)  Medications Ordered in ED Medications  LORazepam (ATIVAN) injection 1 mg (1 mg Intravenous Given 06/09/17 2218)  ondansetron (ZOFRAN) injection 4 mg (4 mg Intravenous Given 06/09/17 2218)  morphine 2 MG/ML injection 4 mg (4 mg Intravenous Given 06/09/17 2217)  sodium chloride 0.9 % bolus 1,000 mL (1,000 mLs Intravenous New Bag/Given 06/09/17 2217)     Initial Impression / Assessment and Plan / ED Course  I have reviewed the triage vital signs  and the nursing notes.  Pertinent labs & imaging results that were available during my care of the patient were reviewed by me and considered in my medical decision making (see chart for details).     31 y.o. female here for ongoing worsening upper abd pain and nausea with associated chills, and diarrhea since a colon cleanse earlier this week (had CT scan 06/08/17 for eval of the abd pain/nausea). Earlier she felt panicky and dry mouthed, and so she came in for evaluation but then states those symptoms resolved and now she just still has GI complaints. Also c/o chills, urinary frequency, and belching. Very odd historian and has other strange unrelated complaints however none are acute. Chart review reveals that her CT abd/pelv from yesterday demonstrated gallbladder stones and sludge, otherwise no obstruction and fatty deposits in colon but no other acute findings. On exam, upper abd TTP with +Murphy's exam, nonperitoneal, no lower abd tenderness. Appears slightly anxious. Given CT findings of gallbladder sludge/stones, and exam that is consistent with this, will obtain labs and U/S of abdomen. Will give pain meds, nausea meds, ativan, and fluids then reassess shortly.   10:53  PM Some delays in getting her IV established and getting labs. Pt feeling better since meds were given. EKG unremarkable. U/A without evidence of infection. Remainder of work up pending. Patient care to be resumed by Charlann Lange, PA-C at shift change sign-out. Patient history has been discussed with midlevel resuming care. Please see their notes for further documentation of pending results and dispo/care. Pt stable at sign-out and updated on transfer of care.    Final Clinical Impressions(s) / ED Diagnoses   Final diagnoses:  Upper abdominal pain  Nausea  Diarrhea, unspecified type    New Prescriptions New Prescriptions   No medications on [address] [place], [place] 06/09/17 2254    Julianne Rice, MD 06/12/17 [phone]

## 2017-06-09 NOTE — ED Notes (Signed)
IV team at bedside 

## 2017-06-09 NOTE — ED Provider Notes (Signed)
No allergic reaction  Here for abdominal pain, nausea, constipation On GI treatment for colon cleanse, GERD Linzess CT scan showed GB sludge, ?stones Tonight had symptoms of panic - = "allergic rxn"  Signed out at the end of shift by [address], PA-C, pending Abd US for eval of cholecystitis. If negative can be discharged back to GI care.   US shows no gall stones and no evidence cholecystitis.  Calcium critically low at 4.9 and potassium depleted to 2.5. Anemia of 8.3 but does not appear symptomatic. She reports heavy periods but records do not show previous anemia.   Electrolyte supplementation ordered. [facility] labs to verify abnormalities. Supplementation delayed until lab confirmation.   Patient requesting her night time medications which are provided.    Elpidio AnisUpstill, Ziare Orrick, PA-C 06/10/17 0354  ADDENDUM: Patient's labs were rechecked to insure the low values of potassium and calcium and found to be normal. She can be discharged home and is encouraged to see her doctor in follow up.    Elpidio AnisUpstill, Nathon Stefanski, PA-C 06/10/17 0559    Loren RacerYelverton, David, MD 06/12/17 2000

## 2017-06-09 NOTE — ED Triage Notes (Signed)
Patient states that 15 minutes ago, she started to have an allergic reaction in the car. Patient does not know what she had a reaction to. Patient is able to talk and vs stable.

## 2017-06-09 NOTE — ED Notes (Signed)
Critical results received from lab. Potassium of 2.5 and Calcium of 4.9 PA Elpidio AnisShari Upstill made aware

## 2017-06-09 NOTE — ED Notes (Signed)
Bed: [facility] Expected date:  Expected time:  Means of arrival:  Comments: 

## 2017-06-10 LAB — HEPATIC FUNCTION PANEL
ALK PHOS: 75 U/L (ref 38–126)
ALT: 35 U/L (ref 14–54)
AST: 33 U/L (ref 15–41)
Albumin: 4 g/dL (ref 3.5–5.0)
Bilirubin, Direct: <0.1 mg/dL — ABNORMAL LOW (ref 0.1–0.5)
TOTAL PROTEIN: 6.7 g/dL (ref 6.5–8.1)
Total Bilirubin: 0.3 mg/dL (ref 0.3–1.2)

## 2017-06-10 LAB — BASIC METABOLIC PANEL
ANION GAP: 12 (ref 5–15)
BUN: 6 mg/dL (ref 6–20)
CO2: 18 mmol/L — AB (ref 22–32)
Calcium: 9 mg/dL (ref 8.9–10.3)
Chloride: 110 mmol/L (ref 101–111)
Creatinine, Ser: 0.78 mg/dL (ref 0.44–1.00)
GFR calc Af Amer: >60 mL/min (ref >60)
GLUCOSE: 118 mg/dL — AB (ref 65–99)
POTASSIUM: 3.8 mmol/L (ref 3.5–5.1)
Sodium: 140 mmol/L (ref 135–145)

## 2017-06-10 MED ORDER — PROPRANOLOL HCL ER 80 MG PO CP24
80.0000 mg | ORAL_CAPSULE | Freq: Every day | ORAL | Status: DC
  Filled 2017-06-10: qty 1

## 2017-06-10 MED ORDER — SODIUM CHLORIDE 0.9 % IV SOLN
1.0000 g | Freq: Once | INTRAVENOUS | Status: DC
  Filled 2017-06-10: qty 10

## 2017-06-10 MED ORDER — GABAPENTIN 300 MG PO CAPS
300.0000 mg | ORAL_CAPSULE | Freq: Once | ORAL | Status: AC
  Administered 2017-06-10: 300 mg via ORAL
  Filled 2017-06-10: qty 1

## 2017-06-10 MED ORDER — ZOLPIDEM TARTRATE 10 MG PO TABS
10.0000 mg | ORAL_TABLET | Freq: Every evening | ORAL | Status: DC | PRN
  Administered 2017-06-10: 10 mg via ORAL
  Filled 2017-06-10: qty 1

## 2017-06-10 MED ORDER — CARISOPRODOL 350 MG PO TABS
350.0000 mg | ORAL_TABLET | Freq: Once | ORAL | Status: AC
  Administered 2017-06-10: 350 mg via ORAL
  Filled 2017-06-10: qty 1

## 2017-06-10 MED ORDER — SODIUM CHLORIDE 0.9 % IV SOLN: 1.0000 g | Freq: Once | INTRAVENOUS | Status: DC

## 2017-06-10 NOTE — ED Notes (Signed)
Repeat blood draw attempted by multiple staff members with no success. Main lab contacted for repeat blood draws

## 2017-06-10 NOTE — ED Notes (Signed)
Lab advised that equipment issues were occurring and there would be a delay in resulting labs.

## 2017-06-10 NOTE — ED Notes (Signed)
Bed: [facility] Expected date:  Expected time:  Means of arrival:  Comments: Margo AyeHall d

## 2017-06-27 ENCOUNTER — Ambulatory Visit (INDEPENDENT_AMBULATORY_CARE_PROVIDER_SITE_OTHER): Payer: Self-pay | Admitting: Orthopaedic Surgery

## 2017-11-13 NOTE — Progress Notes (Deleted)
Tawana ScaleZach Kelden Lavallee D.O. [facility] [address] [place], [place] [address] Phone: [phone] Subjective:    I'm seeing this patient by the request  of:    CC:   KVQ:[id number]:Subjective  Tashina Uvaldo RisingFaulkner is a 31 y.o. female coming in with complaint of ***  Onset-  Location Duration-  Character- Aggravating factors- Reliving factors-  Therapies tried-  Severity-     Past Medical History:  Diagnosis Date  . Hyperlipidemia   . Pseudotumor cerebri    Past Surgical History:  Procedure Laterality Date  . TONSILLECTOMY    . WISDOM TOOTH EXTRACTION     Social History   Socioeconomic History  . Marital status: Married    Spouse name: Not on file  . Number of children: Not on file  . Years of education: Not on file  . Highest education level: Not on file  Social Needs  . Financial resource strain: Not on file  . Food insecurity - worry: Not on file  . Food insecurity - inability: Not on file  . Transportation needs - medical: Not on file  . Transportation needs - non-medical: Not on file  Occupational History  . Not on file  Tobacco Use  . Smoking status: Never Smoker  . Smokeless tobacco: Never Used  Substance and Sexual Activity  . Alcohol use: No  . Drug use: No  . Sexual activity: Not on file  Other Topics Concern  . Not on file  Social History Narrative  . Not on file   Allergies  Allergen Reactions  . Ciprofloxacin Anaphylaxis  . Latex Rash    Redness, itching   . Doxycycline Other (See Comments)    inreased spinal fluid retention.  . Duloxetine Other (See Comments)    Increased depression/suicidal, sweats   . Penicillins Rash    Has patient had a PCN reaction causing immediate rash, facial/tongue/throat swelling, SOB or lightheadedness with hypotension: Yes Has patient had a PCN reaction causing severe rash involving mucus membranes or skin necrosis: No Has patient had a PCN reaction that required hospitalization: No Has patient had a PCN reaction  occurring within the last 10 years: No If all of the above answers are "NO", then may proceed with Cephalosporin use.    No family history on file.   Past medical history, social, surgical and family history all reviewed in electronic medical record.  No pertanent information unless stated regarding to the chief complaint.   Review of Systems:Review of systems updated and as accurate as of 11/13/17  No headache, visual changes, nausea, vomiting, diarrhea, constipation, dizziness, abdominal pain, skin rash, fevers, chills, night sweats, weight loss, swollen lymph nodes, body aches, joint swelling, muscle aches, chest pain, shortness of breath, mood changes.   Objective  There were no vitals taken for this visit. Systems examined below as of 11/13/17   General: No apparent distress alert and oriented x3 mood and affect normal, dressed appropriately.  HEENT: Pupils equal, extraocular movements intact  Respiratory: Patient's speak in full sentences and does not appear short of breath  Cardiovascular: No lower extremity edema, non tender, no erythema  Skin: Warm dry intact with no signs of infection or rash on extremities or on axial skeleton.  Abdomen: Soft nontender  Neuro: Cranial nerves II through XII are intact, neurovascularly intact in all extremities with 2+ DTRs and 2+ pulses.  Lymph: No lymphadenopathy of posterior or anterior cervical chain or axillae bilaterally.  Gait normal with good balance and coordination.  MSK:  Non tender with full range of motion and good stability and symmetric strength and tone of shoulders, elbows, wrist, hip, knee and ankles bilaterally.     Impression and Recommendations:     This case required medical decision making of moderate complexity.      Note: This dictation was prepared with Dragon dictation along with smaller phrase technology. Any transcriptional errors that result from this process are unintentional.

## 2017-11-14 ENCOUNTER — Ambulatory Visit: Payer: BLUE CROSS/BLUE SHIELD | Admitting: Family Medicine

## 2017-11-26 ENCOUNTER — Other Ambulatory Visit: Payer: Self-pay

## 2017-11-26 ENCOUNTER — Emergency Department (HOSPITAL_BASED_OUTPATIENT_CLINIC_OR_DEPARTMENT_OTHER)
Admission: EM | Admit: 2017-11-26 | Discharge: 2017-11-26 | Disposition: A | Discharge status: Home / Self Care | Payer: BLUE CROSS/BLUE SHIELD | Attending: Emergency Medicine | Admitting: Emergency Medicine

## 2017-11-26 ENCOUNTER — Encounter: Payer: Self-pay | Admitting: Emergency Medicine

## 2017-11-26 ENCOUNTER — Emergency Department (HOSPITAL_BASED_OUTPATIENT_CLINIC_OR_DEPARTMENT_OTHER): Payer: BLUE CROSS/BLUE SHIELD

## 2017-11-26 DIAGNOSIS — R197 Diarrhea, unspecified: Secondary | ICD-10-CM | POA: Diagnosis not present

## 2017-11-26 DIAGNOSIS — R072 Precordial pain: Secondary | ICD-10-CM | POA: Insufficient documentation

## 2017-11-26 DIAGNOSIS — R109 Unspecified abdominal pain: Secondary | ICD-10-CM | POA: Diagnosis not present

## 2017-11-26 DIAGNOSIS — R079 Chest pain, unspecified: Secondary | ICD-10-CM | POA: Diagnosis present

## 2017-11-26 HISTORY — DX: Migraine, unspecified, not intractable, without status migrainosus: G43.909

## 2017-11-26 LAB — BASIC METABOLIC PANEL
ANION GAP: 7 (ref 5–15)
BUN: 7 mg/dL (ref 6–20)
CHLORIDE: 104 mmol/L (ref 101–111)
CO2: 27 mmol/L (ref 22–32)
Calcium: 9.3 mg/dL (ref 8.9–10.3)
Creatinine, Ser: 0.59 mg/dL (ref 0.44–1.00)
GFR calc Af Amer: >60 mL/min (ref >60)
GFR calc non Af Amer: >60 mL/min (ref >60)
GLUCOSE: 91 mg/dL (ref 65–99)
POTASSIUM: 3.9 mmol/L (ref 3.5–5.1)
Sodium: 138 mmol/L (ref 135–145)

## 2017-11-26 LAB — CBC
HEMATOCRIT: 45.8 % (ref 36.0–46.0)
HEMOGLOBIN: 15.8 g/dL — AB (ref 12.0–15.0)
MCH: 31.1 pg (ref 26.0–34.0)
MCHC: 34.5 g/dL (ref 30.0–36.0)
MCV: 90.2 fL (ref 78.0–100.0)
Platelets: 333 10*3/uL (ref 150–400)
RBC: 5.08 MIL/uL (ref 3.87–5.11)
RDW: 12.5 % (ref 11.5–15.5)
WBC: 8.1 10*3/uL (ref 4.0–10.5)

## 2017-11-26 LAB — TROPONIN I
Troponin I: <0.03 ng/mL (ref <0.03)
Troponin I: <0.03 ng/mL (ref <0.03)

## 2017-11-26 MED ORDER — ONDANSETRON 4 MG PO TBDP: 4.0000 mg | ORAL_TABLET | Freq: Three times a day (TID) | ORAL | 0 refills | Status: AC | PRN

## 2017-11-26 MED ORDER — ONDANSETRON 4 MG PO TBDP
4.0000 mg | ORAL_TABLET | Freq: Once | ORAL | Status: AC
  Administered 2017-11-26: 4 mg via ORAL
  Filled 2017-11-26: qty 1

## 2017-11-26 MED ORDER — GI COCKTAIL ~~LOC~~
30.0000 mL | Freq: Once | ORAL | Status: AC
  Administered 2017-11-26: 30 mL via ORAL
  Filled 2017-11-26: qty 30

## 2017-11-26 MED ORDER — SUCRALFATE 1 GM/10ML PO SUSP: 1.0000 g | Freq: Three times a day (TID) | ORAL | 0 refills | Status: AC

## 2017-11-26 MED ORDER — DICYCLOMINE HCL 20 MG PO TABS: 20.0000 mg | ORAL_TABLET | Freq: Two times a day (BID) | ORAL | 0 refills | Status: AC

## 2017-11-26 NOTE — ED Notes (Signed)
Patient transported to X-ray 

## 2017-11-26 NOTE — ED Provider Notes (Signed)
Emergency Department Provider Note   I have reviewed the triage vital signs and the nursing notes.   HISTORY  Chief Complaint Chest Pain   HPI Michele SierrasLauren Faulkner is a 31 y.o. female with PMH of HA and HLD presents to the emergency department for evaluation of substernal chest heaviness and pressure with associated nausea and abdominal cramping.  The patient was treating her ongoing constipation with magnesium citrate.  She was up most of the night having diarrhea with she began having central chest pressure early this afternoon. No fever or chills.  No vomiting but she has been nauseated.  She continues to have nonbloody diarrhea.  No history of similar chest pain in the past.   Past Medical History:  Diagnosis Date  . Hyperlipidemia   . Migraines   . Pseudotumor cerebri     There are no active problems to display for this patient.   Past Surgical History:  Procedure Laterality Date  . CHOLECYSTECTOMY    . TONSILLECTOMY    . WISDOM TOOTH EXTRACTION      Current Outpatient Rx  . Order #: [id number] Class: Historical Med  . Order #: [id number] Class: Historical Med  . Order #: [id number] Class: Historical Med  . Order #: [id number] Class: Historical Med  . Order #: [id number] Class: Historical Med  . Order #: [id number] Class: Historical Med  . Order #: [id number] Class: Print  . Order #: [id number] Class: Print  . Order #: [id number] Class: Historical Med  . Order #: [id number] Class: Historical Med  . Order #: [id number] Class: Historical Med  . Order #: [id number] Class: Historical Med  . Order #: [id number] Class: Historical Med  . Order #: [id number] Class: Historical Med  . Order #: [id number] Class: Print  . Order #: [id number] Class: Historical Med  . Order #: [id number] Class: Print  . Order #: [id number] Class: Historical Med  . Order #: [id number] Class: Normal  . Order #: [id number] Class: Historical Med  . Order #: [id number] Class: Print  . Order #: [id number] Class: Historical Med    Allergies Ciprofloxacin; Latex;  Doxycycline; Duloxetine; and Penicillins  History reviewed. No pertinent family history.  Social History Social History   Tobacco Use  . Smoking status: Never Smoker  . Smokeless tobacco: Never Used  Substance Use Topics  . Alcohol use: No  . Drug use: No    Review of Systems  Constitutional: No fever/chills Eyes: No visual changes. ENT: No sore throat. Cardiovascular: Positive chest pain. Respiratory: Denies shortness of breath. Gastrointestinal: Positive cramping abdominal pain. Positive nausea, no vomiting. Positive diarrhea. No constipation. Positive distension and fullness.  Genitourinary: Negative for dysuria. Musculoskeletal: Negative for back pain. Skin: Negative for rash. Neurological: Negative for headaches, focal weakness or numbness.  10-point ROS otherwise negative.  ____________________________________________   PHYSICAL EXAM:  VITAL SIGNS: ED Triage Vitals  Enc Vitals Group     BP 11/26/17 1447 (!) 143/85     Pulse Rate 11/26/17 1447 61     Resp 11/26/17 1447 18     Temp 11/26/17 1447 98 F (36.7 C)     Temp Source 11/26/17 1447 Oral     SpO2 11/26/17 1447 100 %     Weight 11/26/17 1445 200 lb (90.7 kg)     Pain Score 11/26/17 1444 8   Constitutional: Alert and oriented. Well appearing and in no acute distress. Eyes: Conjunctivae are normal.  Head: Atraumatic. Nose: No congestion/rhinnorhea. Mouth/Throat: Mucous membranes are moist.  Neck: No stridor.  Cardiovascular: Normal rate, regular rhythm. Good peripheral circulation. Grossly normal heart sounds.   Respiratory: Normal respiratory  effort.  No retractions. Lungs CTAB. Gastrointestinal: Soft and nontender. Positive mild distention.  Musculoskeletal: No lower extremity tenderness nor edema. No gross deformities of extremities. Neurologic:  Normal speech and language. No gross focal neurologic deficits are appreciated.  Skin:  Skin is warm, dry and intact. No rash  noted.  ____________________________________________   LABS (all labs ordered are listed, but only abnormal results are displayed)  Labs Reviewed  CBC - Abnormal; Notable for the following components:      Result Value   Hemoglobin 15.8 (*)    All other components within normal limits  BASIC METABOLIC PANEL  TROPONIN I  TROPONIN I   ____________________________________________  EKG   EKG Interpretation  Date/Time:  Monday November 26 2017 14:43:18 EST Ventricular Rate:  59 PR Interval:  186 QRS Duration: 82 QT Interval:  408 QTC Calculation: 403 R Axis:   71 Text Interpretation:  Sinus bradycardia Otherwise normal ECG No STEMI.  Confirmed by Alona BeneLong, Cheryln Balcom [phone]([id number]) on 11/26/2017 4:17:24 PM       ____________________________________________  RADIOLOGY  Dg Chest 2 View  Result Date: 11/26/2017 CLINICAL DATA:  Chest pain EXAM: CHEST  2 VIEW COMPARISON:  None. FINDINGS: The heart size and mediastinal contours are within normal limits. Both lungs are clear. The visualized skeletal structures are unremarkable. IMPRESSION: No active cardiopulmonary disease. Electronically Signed   By: Jasmine PangKim  Fujinaga M.D.   On: 11/26/2017 14:56   Dg Abd 2 Views  Result Date: 11/26/2017 CLINICAL DATA:  31 year old with acute onset of nausea, vomiting and diarrhea that began last night. EXAM: ABDOMEN - 2 VIEW COMPARISON:  None. FINDINGS: Bowel gas pattern unremarkable without evidence of obstruction or significant ileus. No evidence of free air or significant air-fluid levels on the erect image. Expected stool burden in the colon. Phleboliths in both sides of the pelvis. No visible opaque urinary tract calculi. Surgical clips in the right upper quadrant from prior cholecystectomy. IMPRESSION: No acute abdominal abnormality. Electronically Signed   By: Hulan Saashomas  Lawrence M.D.   On: 11/26/2017 16:47    ____________________________________________   PROCEDURES  Procedure(s) performed:    Procedures  None ____________________________________________   INITIAL IMPRESSION / ASSESSMENT AND PLAN / ED COURSE  Pertinent labs & imaging results that were available during my care of the patient were reviewed by me and considered in my medical decision making (see chart for details).  Patient presents to the emergency department with chest pain in the setting of diarrhea precipitated by using magnesium citrate.  The patient has no focal abdominal tenderness.  Abdomen is mildly distended but low suspicion for obstruction.  Will add on 2 view plain film of the abdomen.  Plan to also give Zofran and GI cocktail for chest discomfort.  Suspect is a GI source patient has 2 risk factors including elevated BMI and hyperlipidemia.  EKG is unremarkable.  With chest pain onset in the last 2-3 hours plan for repeat troponin at 3-hour mark.  Repeat troponin negative. Plan for discharge home with plan for supportive care. Suspect non-cardiac chest pain but Cardiology contact info provided.   At this time, I do not feel there is any life-threatening condition present. I have reviewed and discussed all results (EKG, imaging, lab, urine as appropriate), exam findings with patient. I have reviewed nursing notes and appropriate previous records.  I feel the patient is safe to be discharged home without further emergent workup. Discussed usual and customary return precautions. Patient and family (if present) verbalize understanding and are comfortable  with this plan.  Patient will follow-up with their primary care provider. If they do not have a primary care provider, information for follow-up has been provided to them. All questions have been answered.  ____________________________________________  FINAL CLINICAL IMPRESSION(S) / ED DIAGNOSES  Final diagnoses:  Abdominal pain  Precordial chest pain  Diarrhea, unspecified type     MEDICATIONS GIVEN DURING THIS VISIT:  Medications  gi cocktail  (Maalox,Lidocaine,Donnatal) (30 mLs Oral Given 11/26/17 1624)  ondansetron (ZOFRAN-ODT) disintegrating tablet 4 mg (4 mg Oral Given 11/26/17 1624)    Note:  This document was prepared using Dragon voice recognition software and may include unintentional dictation errors.  Alona Bene, MD Emergency Medicine   Orby Tangen, Arlyss Repress, MD 11/26/17 [phone]

## 2017-11-26 NOTE — ED Triage Notes (Signed)
Presents with Sternal chest pain and heaviness that began last night described as burning and associted with nausea and muscle cramps. She took a magnesium citrate last night due to constipation and has had diarrhea all night. This pain began this AM. She has been unable to drink or eat today due to pain and nausea.

## 2017-11-26 NOTE — Discharge Instructions (Signed)

## 2017-11-26 NOTE — ED Notes (Signed)
ED Provider at bedside. 

## 2017-12-10 ENCOUNTER — Ambulatory Visit: Payer: BLUE CROSS/BLUE SHIELD | Admitting: Family Medicine

## 2017-12-26 ENCOUNTER — Ambulatory Visit: Payer: BLUE CROSS/BLUE SHIELD | Admitting: Family Medicine

## 2018-07-29 ENCOUNTER — Encounter (HOSPITAL_COMMUNITY): Payer: Self-pay | Admitting: Emergency Medicine

## 2018-07-29 ENCOUNTER — Other Ambulatory Visit: Payer: Self-pay

## 2018-07-29 ENCOUNTER — Emergency Department (HOSPITAL_COMMUNITY)
Admission: EM | Admit: 2018-07-29 | Discharge: 2018-07-29 | Disposition: A | Discharge status: Home / Self Care | Payer: BLUE CROSS/BLUE SHIELD | Attending: Emergency Medicine | Admitting: Emergency Medicine

## 2018-07-29 DIAGNOSIS — R51 Headache: Secondary | ICD-10-CM | POA: Insufficient documentation

## 2018-07-29 DIAGNOSIS — Z5321 Procedure and treatment not carried out due to patient leaving prior to being seen by health care provider: Secondary | ICD-10-CM | POA: Insufficient documentation

## 2018-07-29 NOTE — ED Notes (Signed)
Pt came to desk and expressed that she wanted to go home and not be seen.

## 2018-07-29 NOTE — ED Triage Notes (Signed)
Pt c/o headache with light sensitivity x "a few days". Hx of pseudo tumor cerebri, pt does not have a neurologist at this time.

## 2018-07-31 NOTE — ED Notes (Signed)
Follow up call made  No answer  07/31/18  0840  s Karinna Beadles rn

## 2018-11-26 ENCOUNTER — Other Ambulatory Visit: Payer: Self-pay

## 2018-11-26 ENCOUNTER — Encounter (HOSPITAL_BASED_OUTPATIENT_CLINIC_OR_DEPARTMENT_OTHER): Payer: Self-pay

## 2018-11-26 ENCOUNTER — Emergency Department (HOSPITAL_BASED_OUTPATIENT_CLINIC_OR_DEPARTMENT_OTHER)
Admission: EM | Admit: 2018-11-26 | Discharge: 2018-11-26 | Discharge status: Against Medical Advice | Payer: Self-pay | Attending: Emergency Medicine | Admitting: Emergency Medicine

## 2018-11-26 DIAGNOSIS — Z5321 Procedure and treatment not carried out due to patient leaving prior to being seen by health care provider: Secondary | ICD-10-CM | POA: Insufficient documentation

## 2018-11-26 DIAGNOSIS — M545 Low back pain: Secondary | ICD-10-CM | POA: Insufficient documentation

## 2018-11-26 LAB — URINALYSIS, ROUTINE W REFLEX MICROSCOPIC
Bilirubin Urine: NEGATIVE
Glucose, UA: NEGATIVE mg/dL
Hgb urine dipstick: NEGATIVE
KETONES UR: NEGATIVE mg/dL
LEUKOCYTES UA: NEGATIVE
NITRITE: NEGATIVE
Protein, ur: NEGATIVE mg/dL
Specific Gravity, Urine: 1.01 (ref 1.005–1.030)
pH: 6.5 (ref 5.0–8.0)

## 2018-11-26 NOTE — ED Triage Notes (Signed)
Pt c/o left flank pain day 3-states feels like kidney stone pain-NAD-steady gait

## 2018-11-26 NOTE — ED Notes (Signed)
Pt called to take back to a room no answer 

## 2019-06-27 ENCOUNTER — Other Ambulatory Visit: Payer: Self-pay

## 2019-06-27 ENCOUNTER — Encounter (HOSPITAL_BASED_OUTPATIENT_CLINIC_OR_DEPARTMENT_OTHER): Payer: Self-pay | Admitting: *Deleted

## 2019-06-27 ENCOUNTER — Emergency Department (HOSPITAL_BASED_OUTPATIENT_CLINIC_OR_DEPARTMENT_OTHER)
Admission: EM | Admit: 2019-06-27 | Discharge: 2019-06-27 | Disposition: A | Discharge status: Home / Self Care | Payer: BC Managed Care – PPO | Attending: Emergency Medicine | Admitting: Emergency Medicine

## 2019-06-27 ENCOUNTER — Emergency Department (HOSPITAL_BASED_OUTPATIENT_CLINIC_OR_DEPARTMENT_OTHER): Payer: BC Managed Care – PPO

## 2019-06-27 DIAGNOSIS — R079 Chest pain, unspecified: Secondary | ICD-10-CM | POA: Diagnosis not present

## 2019-06-27 DIAGNOSIS — R0789 Other chest pain: Secondary | ICD-10-CM

## 2019-06-27 DIAGNOSIS — Z79899 Other long term (current) drug therapy: Secondary | ICD-10-CM | POA: Diagnosis not present

## 2019-06-27 DIAGNOSIS — Z91040 Latex allergy status: Secondary | ICD-10-CM | POA: Diagnosis not present

## 2019-06-27 DIAGNOSIS — Z9049 Acquired absence of other specified parts of digestive tract: Secondary | ICD-10-CM | POA: Diagnosis not present

## 2019-06-27 LAB — BASIC METABOLIC PANEL
Anion gap: 11 (ref 5–15)
BUN: 15 mg/dL (ref 6–20)
CO2: 22 mmol/L (ref 22–32)
Calcium: 9.4 mg/dL (ref 8.9–10.3)
Chloride: 104 mmol/L (ref 98–111)
Creatinine, Ser: 0.91 mg/dL (ref 0.44–1.00)
GFR calc Af Amer: >60 mL/min (ref >60)
GFR calc non Af Amer: >60 mL/min (ref >60)
Glucose, Bld: 101 mg/dL — ABNORMAL HIGH (ref 70–99)
Potassium: 3.7 mmol/L (ref 3.5–5.1)
Sodium: 137 mmol/L (ref 135–145)

## 2019-06-27 LAB — CBC
HCT: 46.2 % — ABNORMAL HIGH (ref 36.0–46.0)
Hemoglobin: 15 g/dL (ref 12.0–15.0)
MCH: 30.2 pg (ref 26.0–34.0)
MCHC: 32.5 g/dL (ref 30.0–36.0)
MCV: 93.1 fL (ref 80.0–100.0)
Platelets: 296 10*3/uL (ref 150–400)
RBC: 4.96 MIL/uL (ref 3.87–5.11)
RDW: 12.7 % (ref 11.5–15.5)
WBC: 10 10*3/uL (ref 4.0–10.5)
nRBC: 0 % (ref 0.0–0.2)

## 2019-06-27 LAB — TROPONIN I (HIGH SENSITIVITY): Troponin I (High Sensitivity): <2 ng/L (ref <18)

## 2019-06-27 LAB — PREGNANCY, URINE: Preg Test, Ur: NEGATIVE

## 2019-06-27 MED ORDER — ASPIRIN 81 MG PO CHEW
324.0000 mg | CHEWABLE_TABLET | Freq: Once | ORAL | Status: AC
  Administered 2019-06-27: 324 mg via ORAL
  Filled 2019-06-27: qty 4

## 2019-06-27 MED ORDER — PROCHLORPERAZINE EDISYLATE 10 MG/2ML IJ SOLN
10.0000 mg | Freq: Once | INTRAMUSCULAR | Status: DC
  Filled 2019-06-27: qty 2

## 2019-06-27 MED ORDER — DIPHENHYDRAMINE HCL 50 MG/ML IJ SOLN
50.0000 mg | Freq: Once | INTRAMUSCULAR | Status: DC
  Filled 2019-06-27: qty 1

## 2019-06-27 MED ORDER — SODIUM CHLORIDE 0.9 % IV BOLUS
1000.0000 mL | Freq: Once | INTRAVENOUS | Status: AC
  Administered 2019-06-27: 1000 mL via INTRAVENOUS

## 2019-06-27 MED ORDER — KETOROLAC TROMETHAMINE 30 MG/ML IJ SOLN
30.0000 mg | Freq: Once | INTRAMUSCULAR | Status: AC
  Administered 2019-06-27: 30 mg via INTRAVENOUS
  Filled 2019-06-27: qty 1

## 2019-06-27 NOTE — ED Notes (Signed)
Pt. Asking for something for her headache at this time and has a diagnosis of headaches.

## 2019-06-27 NOTE — ED Triage Notes (Signed)
Chest pain x 30 minutes. States it feels like a "electrocuting" pain. She used albuterol for the first time today.

## 2019-06-27 NOTE — Discharge Instructions (Addendum)
You were seen in the ED today for chest pain Your labwork, EKG, and chest x ray were all very reassuring Please follow up with your PCP regarding this pain Return to the ED for any worsening symptoms

## 2019-06-27 NOTE — ED Provider Notes (Signed)
[facility] EMERGENCY DEPARTMENT Provider Note   CSN: [id number] Arrival date & time: 06/27/19  1830    History   Chief Complaint Chief Complaint  Patient presents with  . Chest Pain    HPI Michele Day is a 33 y.o. female with PMHx pseudotumor cerebri and migraines who presents to the ED complaining of sudden onset, intermittent, left sided chest pain that began 30 minutes PTA. Pt also complains of her usual migraine headache x today; she typically has nausea with the headache. Pt has never had chest pain like this which concerned her. She had not taken anything for the pain. Denies fever, chills, cough, shortness of breath, vomiting, leg swelling, or any other associated symptoms. No recent prolonged travel or immobilization. No hx DVT/PE. No OCPs. Pt is a never smoker. Reports grandfather passed away in his 2250s from an MI.        Past Medical History:  Diagnosis Date  . Hyperlipidemia   . Migraines   . Pseudotumor cerebri     There are no active problems to display for this patient.   Past Surgical History:  Procedure Laterality Date  . ABDOMINAL HYSTERECTOMY    . CHOLECYSTECTOMY    . TONSILLECTOMY    . WISDOM TOOTH EXTRACTION       OB History   No obstetric history on file.      Home Medications    Prior to Admission medications   Medication Sig Start Date End Date Taking? Authorizing Provider  omeprazole (PRILOSEC) 40 MG capsule Take 40 mg by mouth daily. 06/04/17  Yes [provider]  propranolol ER (INDERAL LA) 80 MG 24 hr capsule Take 80 mg by mouth daily. 06/07/17  Yes [provider]  rizatriptan (MAXALT) 10 MG tablet Take 10 mg by mouth as needed for migraine. 05/19/16  Yes [provider]  bisacodyl (DULCOLAX) 5 MG EC tablet Take 10 mg by mouth once.    [provider]  carisoprodol (SOMA) 350 MG tablet Take 350 mg by mouth at bedtime. 05/01/17   [provider]  cyclobenzaprine (FLEXERIL) 10 MG  tablet Take 5 mg by mouth daily as needed for spasms. 05/02/17   [provider]  dicyclomine (BENTYL) 20 MG tablet Take 1 tablet (20 mg total) by mouth 2 (two) times daily. 11/26/17   Long, Arlyss RepressJoshua G, MD  fluticasone (FLONASE) 50 MCG/ACT nasal spray Place 2 sprays into both nostrils daily. For at least 2 weeks, then daily as needed Patient not taking: Reported on 06/09/2017 06/22/16   Lurene ShadowPhelps, Erin O, PA-C  furosemide (LASIX) 40 MG tablet Take 40 mg by mouth daily.     [provider]  gabapentin (NEURONTIN) 100 MG capsule Take 100 mg by mouth 2 (two) times daily. 100 mg in am, 100 mg noon. 06/02/17   [provider]  gabapentin (NEURONTIN) 300 MG capsule Take 300 mg by mouth at bedtime. 06/02/17   [provider]  HYDROcodone-acetaminophen (NORCO/VICODIN) 5-325 MG tablet Take 1 tablet by mouth daily as needed for pain. 05/22/17   [provider]  lamoTRIgine (LAMICTAL) 25 MG tablet Take 25 mg by mouth daily. 06/02/17   [provider]  LINZESS 145 MCG CAPS capsule Take 145 mcg by mouth daily. 06/05/17   [provider]  LORazepam (ATIVAN) 0.5 MG tablet Take 0.5 mg by mouth at bedtime.     [provider]  medroxyPROGESTERone (PROVERA) 10 MG tablet Take 10 mg by mouth as directed. Take 10  mg 15-25 of each month 05/17/17   [provider]  naproxen (NAPROSYN) 500 MG tablet Take 1 tablet (500 mg total) by mouth 2 (two) times daily. Patient not taking: Reported on 06/09/2017 06/22/16   Noe Gens, PA-C  ondansetron (ZOFRAN ODT) 4 MG disintegrating tablet Take 1 tablet (4 mg total) by mouth every 8 (eight) hours as needed for nausea or vomiting. 11/26/17   Long, Wonda Olds, MD  polyethylene glycol powder (GLYCOLAX/MIRALAX) powder Take 17 g by mouth daily as needed for constipation. 06/05/17   [provider]  promethazine (PHENERGAN) 25 MG tablet Take 1 tablet (25 mg total) by mouth every 6 (six) hours as needed for nausea or  vomiting. Patient not taking: Reported on 06/09/2017 04/07/16   Fransico Meadow, PA-C  sucralfate (CARAFATE) 1 GM/10ML suspension Take 10 mLs (1 g total) by mouth 4 (four) times daily -  with meals and at bedtime. 11/26/17   Long, Wonda Olds, MD  zolpidem (AMBIEN) 10 MG tablet Take 10 mg by mouth at bedtime. 06/02/17   [provider]    Family History No family history on file.  Social History Social History   Tobacco Use  . Smoking status: Never Smoker  . Smokeless tobacco: Never Used  Substance Use Topics  . Alcohol use: No  . Drug use: No     Allergies   Ciprofloxacin, Latex, Benadryl [diphenhydramine], Doxycycline, Duloxetine, and Penicillins   Review of Systems Review of Systems  Constitutional: Negative for chills and fever.  HENT: Negative for congestion.   Eyes: Negative for visual disturbance.  Respiratory: Negative for cough and shortness of breath.   Cardiovascular: Positive for chest pain. Negative for palpitations and leg swelling.  Gastrointestinal: Positive for nausea. Negative for abdominal pain, constipation, diarrhea and vomiting.  Genitourinary: Negative for difficulty urinating.  Musculoskeletal: Negative for myalgias, neck pain and neck stiffness.  Skin: Negative for rash.  Neurological: Positive for headaches. Negative for dizziness, syncope and light-headedness.     Physical Exam Updated Vital Signs BP 119/80   Pulse 62   Temp 98.5 F (36.9 C) (Oral)   Resp 16   Ht 5\' 9"  (1.753 m)   Wt 97.5 kg   LMP 06/09/2017   SpO2 100%   BMI 31.75 kg/m   Physical Exam Vitals signs and nursing note reviewed.  Constitutional:      Appearance: She is not ill-appearing.  HENT:     Head: Normocephalic and atraumatic.  Eyes:     Conjunctiva/sclera: Conjunctivae normal.  Neck:     Musculoskeletal: Neck supple.  Cardiovascular:     Rate and Rhythm: Normal rate and regular rhythm.  Pulmonary:     Effort: Pulmonary effort is normal.     Breath  sounds: Normal breath sounds. No decreased breath sounds, wheezing, rhonchi or rales.  Chest:     Chest wall: Tenderness present.     Comments: Mild TTP along left chest wall Abdominal:     Palpations: Abdomen is soft.     Tenderness: There is no abdominal tenderness.  Skin:    General: Skin is warm and dry.  Neurological:     Mental Status: She is alert.      ED Treatments / Results  Labs (all labs ordered are listed, but only abnormal results are displayed) Labs Reviewed  BASIC METABOLIC PANEL - Abnormal; Notable for the following components:      Result Value   Glucose, Bld 101 (*)    All other  components within normal limits  CBC - Abnormal; Notable for the following components:   HCT 46.2 (*)    All other components within normal limits  PREGNANCY, URINE  CBG MONITORING, ED  TROPONIN I (HIGH SENSITIVITY)    EKG None  Radiology Dg Chest Portable 1 View  Result Date: 06/27/2019 CLINICAL DATA:  Chest pain. EXAM: PORTABLE CHEST 1 VIEW COMPARISON:  November 26, 2017 FINDINGS: The heart size and mediastinal contours are within normal limits. Both lungs are clear. The visualized skeletal structures are unremarkable. IMPRESSION: No active disease. Electronically Signed   By: Katherine Mantlehristopher  Green M.D.   On: 06/27/2019 20:05    Procedures Procedures (including critical care time)  Medications Ordered in ED Medications  aspirin chewable tablet 324 mg (324 mg Oral Given 06/27/19 1947)  sodium chloride 0.9 % bolus 1,000 mL (0 mLs Intravenous Stopped 06/27/19 2050)  ketorolac (TORADOL) 30 MG/ML injection 30 mg (30 mg Intravenous Given 06/27/19 2050)     Initial Impression / Assessment and Plan / ED Course  I have reviewed the triage vital signs and the nursing notes.  Pertinent labs & imaging results that were available during my care of the patient were reviewed by me and considered in my medical decision making (see chart for details).    33 year old female presenting to  the ED with complaints of chest pain suddenly x today. Pt also endorses nausea but has headache (with hx of HA) and typically becomes nauseated with it. States this headache feels similar to the rest. Is requesting something for headache while she is here - IVF initiated with compazine and benadryl although pt then reported allergy to benadryl; both held off and switched to toradol IV.   Regarding chest pain - very low suspicion for ACS today although will work up to rule out. She is PERC negative on exam. No d dimer ordered today. 325 mg aspirin given as pt was complaining of intermittent discomfort while in the ED. EKG without ischemic changes. CXR reassuring and remainder of labwork negative including < 2 troponin. No repeat trop ordered at this time. Pt reports improvement in symptoms with aspirin. Upon further questioning she reports she did do some heavy lifting yesterday. Suspect MSK pain. Will have patient follow up with PCP regarding symptoms. She is in agreement with plan at this time and stable for discharge home.        Final Clinical Impressions(s) / ED Diagnoses   Final diagnoses:  Atypical chest pain    ED Discharge Orders    None       Tanda RockersVenter, Zacariah Belue, PA-C 06/27/19 2150    Arby BarrettePfeiffer, Marcy, MD 07/01/19 1005

## 2019-06-27 NOTE — ED Notes (Signed)
X-ray at bedside

## 2019-06-27 NOTE — ED Notes (Signed)
Pt states that she is allergic to benadryl. EDP aware and stated to hold off on benadryl and compazine injections.

## 2021-05-10 ENCOUNTER — Emergency Department
Admission: EM | Admit: 2021-05-10 | Discharge: 2021-05-10 | Disposition: A | Discharge status: Home / Self Care | Payer: BC Managed Care – PPO | Source: Home / Self Care

## 2021-05-10 HISTORY — DX: Gastro-esophageal reflux disease without esophagitis: K21.9

## 2021-05-10 HISTORY — DX: Depression, unspecified: F32.A

## 2021-05-10 HISTORY — DX: Irritable bowel syndrome, unspecified: K58.9

## 2021-05-10 HISTORY — DX: Anxiety disorder, unspecified: F41.9

## 2021-05-10 NOTE — ED Triage Notes (Signed)
Pt presents to Urgent Care with c/o headache, dizziness, and intermittent nausea following a fall approx 1 hour ago. Pt reports tripping over a curb, rolling her R ankle, and landing on her L knee and hands. L knee has superficial abrasion. Cleansed w/ antiseptic wound wash. Pt states her head didn't hit the ground, but her forehead was "only inches away from the ground," and that she suspects she has a concussion d/t "whiplash." PERLA. No numbness or tingling anywhere, full ROM.

## 2021-07-06 ENCOUNTER — Other Ambulatory Visit: Payer: Self-pay | Admitting: Physical Medicine and Rehabilitation

## 2021-07-06 DIAGNOSIS — M542 Cervicalgia: Secondary | ICD-10-CM

## 2021-08-06 ENCOUNTER — Ambulatory Visit
Admission: RE | Admit: 2021-08-06 | Discharge: 2021-08-06 | Disposition: A | Discharge status: Home / Self Care | Payer: 59 | Source: Ambulatory Visit | Attending: Physical Medicine and Rehabilitation | Admitting: Physical Medicine and Rehabilitation

## 2021-08-06 DIAGNOSIS — M542 Cervicalgia: Secondary | ICD-10-CM

## 2022-03-07 IMAGING — MR MR CERVICAL SPINE W/O CM
4 of 5 series · 29 of 48 positions shown · non-contrast
Comparison: Prior radiograph from 09/16/2010.

CLINICAL DATA: Initial evaluation for left-sided neck and shoulder
pain with associated headaches.

EXAM:
MRI CERVICAL SPINE WITHOUT CONTRAST
TECHNIQUE: Multiplanar, multisequence MR imaging of the cervical spine was
performed. No intravenous contrast was administered.

[Series 2: T2 · sagittal · 3.0mm · 0.66mm/px · 7 of 16 slices shown (1 of 2)]
[im 1/16]
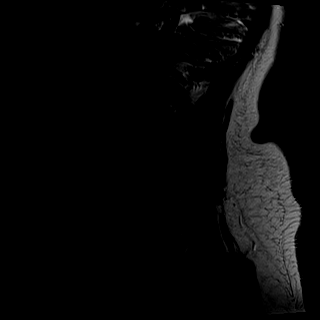
[im 3/16]
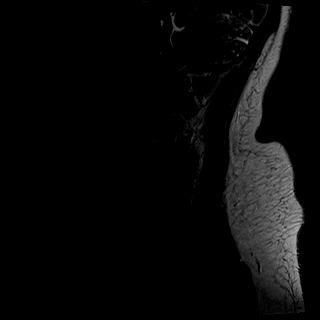
[im 6/16]
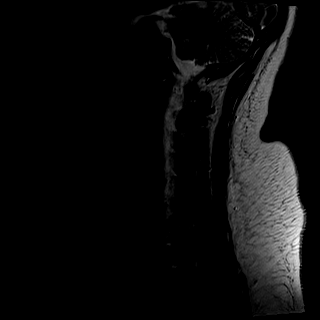
[im 8/16]
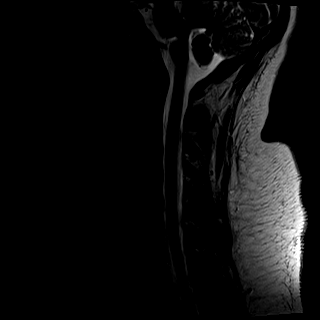
[im 11/16]
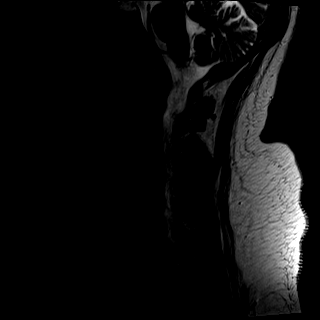
[im 13/16]
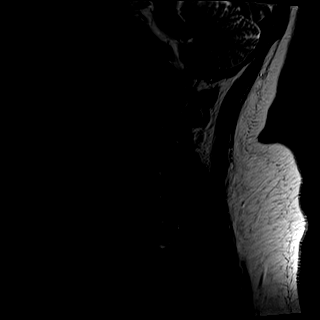
[im 16/16]
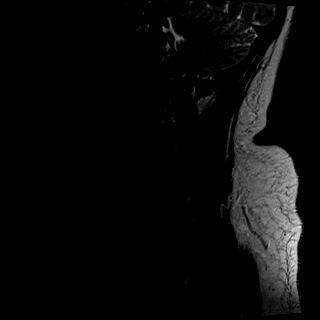

[Series 3: T1 · sagittal · 3.0mm · 0.41mm/px · 7 of 16 slices shown]
[im 1/16]
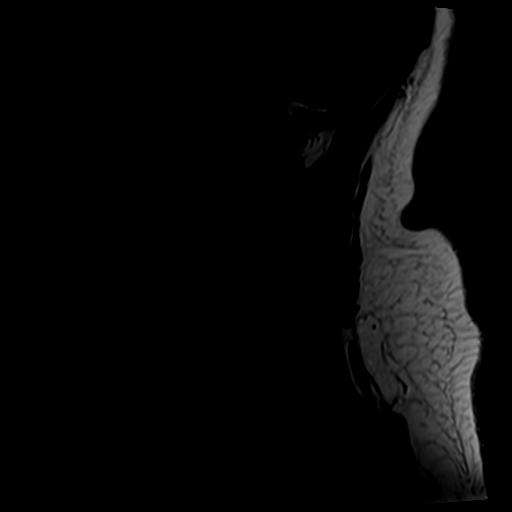
[im 3/16]
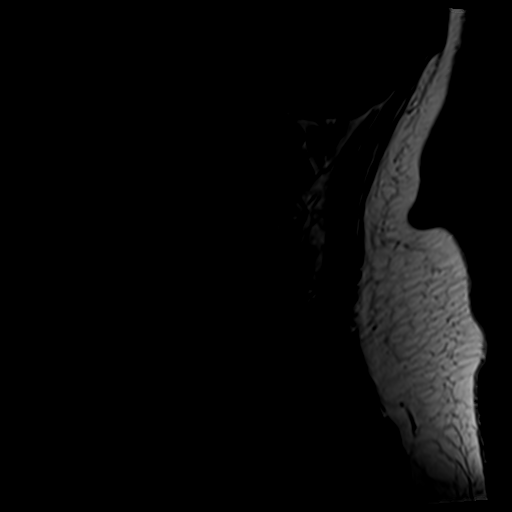
[im 6/16]
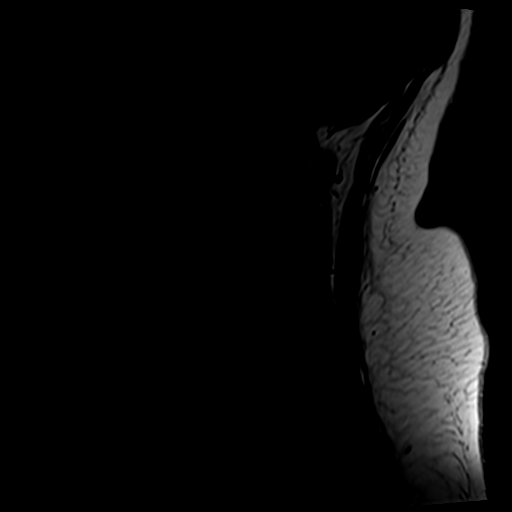
[im 8/16]
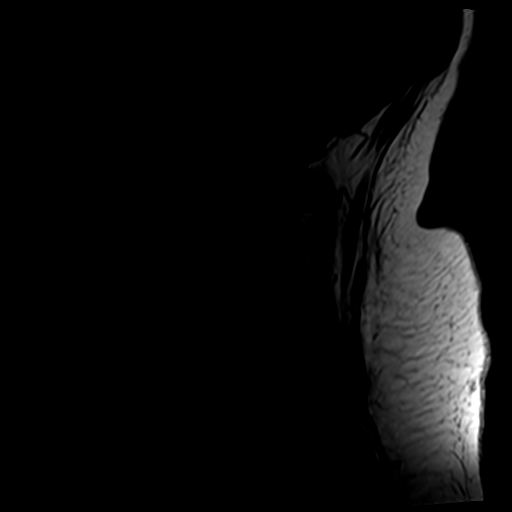
[im 11/16]
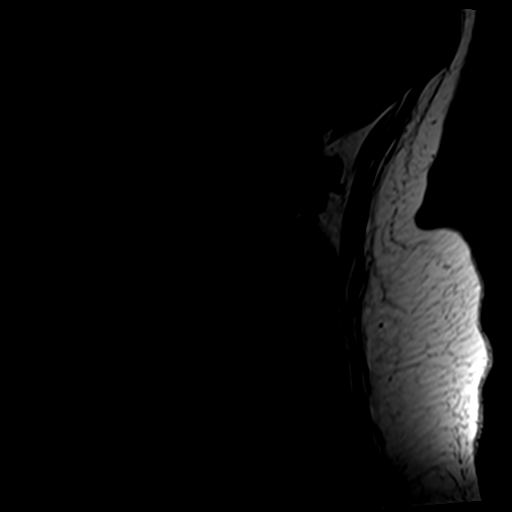
[im 13/16]
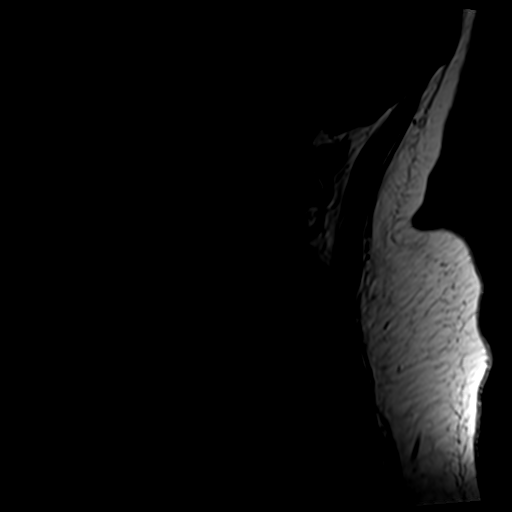
[im 16/16]
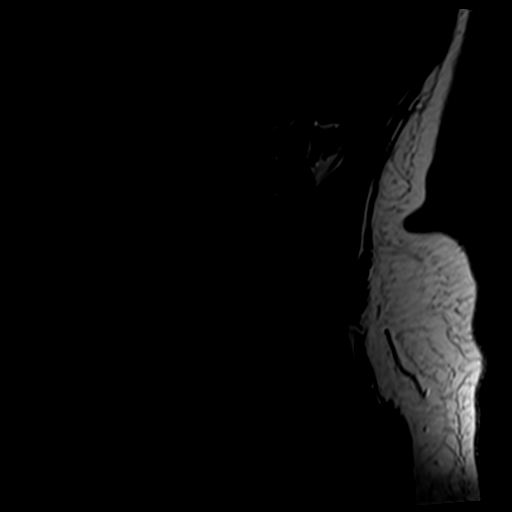

[Series 4: tir sag · sagittal · 3.0mm · 0.41mm/px · 6 of 16 slices shown]
[im 1/16]
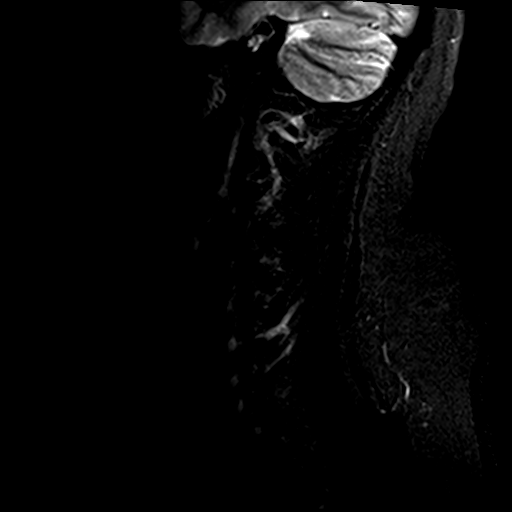
[im 3/16]
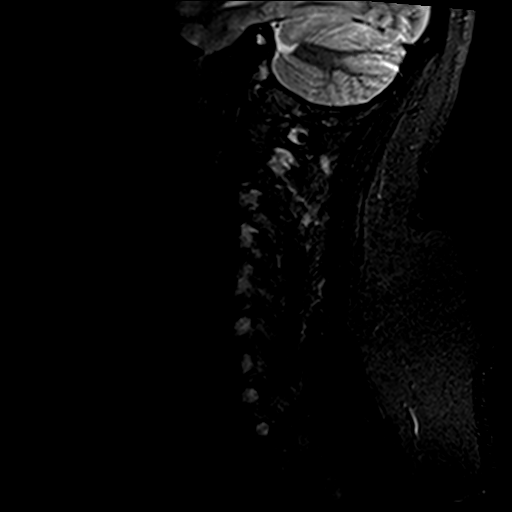
[im 5/16]
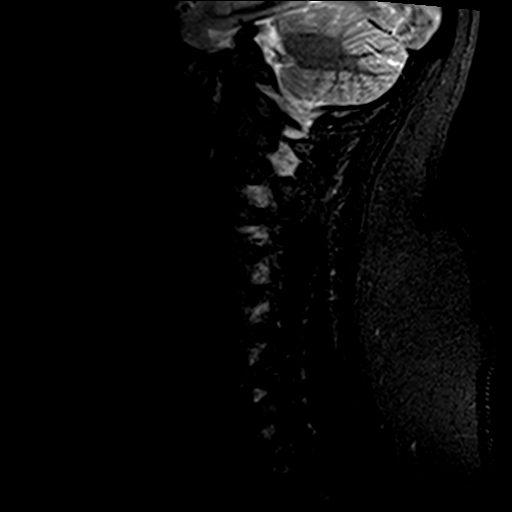
[im 7/16]
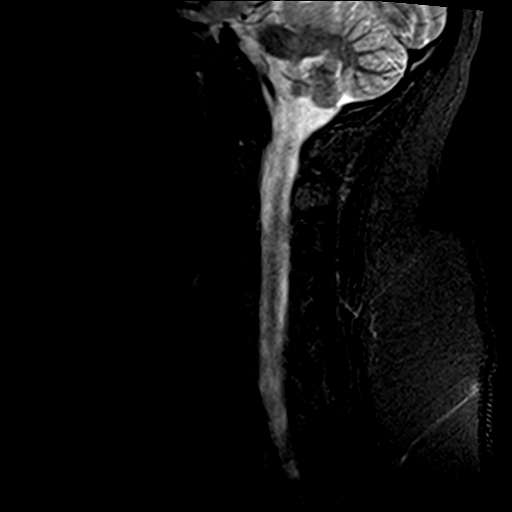
[im 9/16]
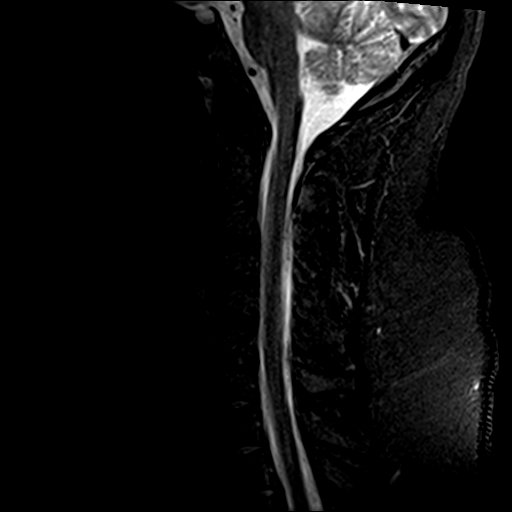
[im 13/16]
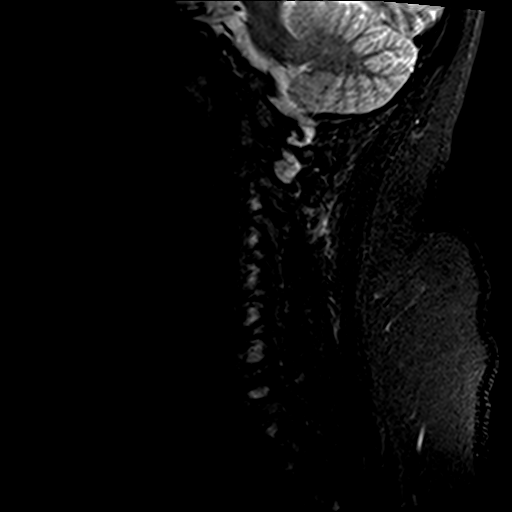

[Series 7: T2 · axial · 3.0mm · 0.70mm/px · z∈[-80,+18]mm · 9 of 27 slices shown (2 of 2)]
[im 1/27]
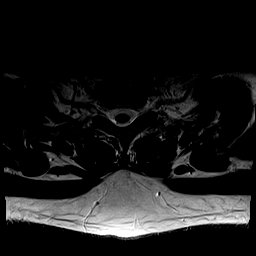
[im 5/27]
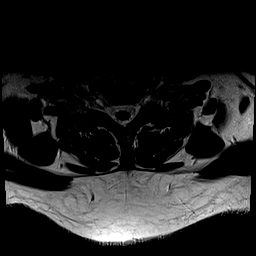
[im 9/27]
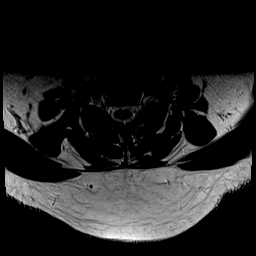
[im 11/27]
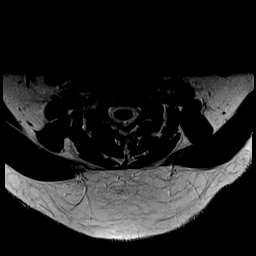
[im 14/27]
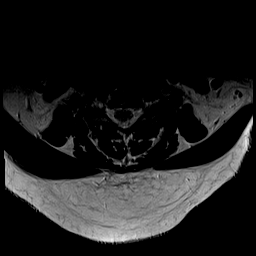
[im 16/27]
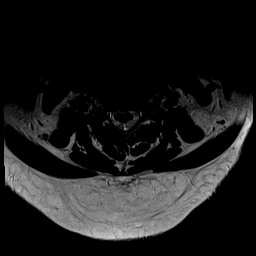
[im 18/27]
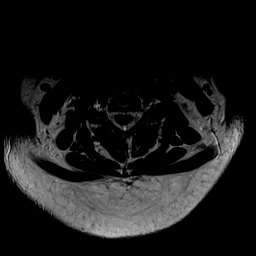
[im 22/27]
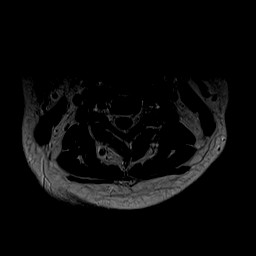
[im 27/27]
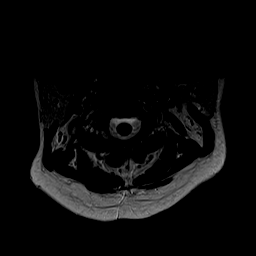

[29 of 48 positions shown; findings below may reference images not displayed]

FINDINGS: Alignment: Straightening of the normal cervical lordosis. No
listhesis.

Vertebrae: Vertebral body height maintained without acute or chronic
fracture. Bone marrow signal intensity within normal limits. No
discrete or worrisome osseous lesions. No abnormal marrow edema.

Cord: Normal signal and morphology.

Posterior Fossa, vertebral arteries, paraspinal tissues: Visualized
brain and posterior fossa within normal limits. Craniocervical
junction normal. Paraspinous and prevertebral soft tissues within
normal limits. Normal intravascular flow voids seen within the
vertebral arteries bilaterally.

Disc levels:

No significant disc pathology seen within the cervical spine. No
disc bulge or focal disc herniation. Minimal reactive endplate
change noted at the superior endplate of C7. Mild left-sided facet
hypertrophy noted at C7-T1. No significant canal or neural foraminal
stenosis. No evidence for neural impingement.
IMPRESSION: 1. Mild left-sided facet hypertrophy at C7-T1.
2. Otherwise essentially normal MRI of the cervical spine. No
significant disc pathology, stenosis, or evidence for neural
impingement.

## 2022-09-06 ENCOUNTER — Ambulatory Visit
Admission: RE | Admit: 2022-09-06 | Discharge: 2022-09-06 | Disposition: A | Discharge status: Home / Self Care | Payer: BLUE CROSS/BLUE SHIELD | Source: Ambulatory Visit | Attending: Family Medicine | Admitting: Family Medicine

## 2022-09-06 VITALS — BP 113/78 | HR 91 | Temp 98.5°F | Resp 18 | Ht 70.0 in | Wt 225.0 lb

## 2022-09-06 DIAGNOSIS — J0101 Acute recurrent maxillary sinusitis: Secondary | ICD-10-CM | POA: Diagnosis not present

## 2022-09-06 DIAGNOSIS — Z9109 Other allergy status, other than to drugs and biological substances: Secondary | ICD-10-CM

## 2022-09-06 LAB — POC SARS CORONAVIRUS 2 AG -  ED: SARS Coronavirus 2 Ag: NEGATIVE

## 2022-09-06 MED ORDER — CEFDINIR 300 MG PO CAPS: 300.0000 mg | ORAL_CAPSULE | Freq: Two times a day (BID) | ORAL | 0 refills | Status: AC

## 2022-09-06 MED ORDER — PREDNISONE 20 MG PO TABS: 20.0000 mg | ORAL_TABLET | Freq: Two times a day (BID) | ORAL | 0 refills | Status: AC

## 2022-09-06 NOTE — Discharge Instructions (Signed)
Take the antibiotic 2 times a day Take prednisone 2 times a day Drink to fluids Continue using your salt water or saline sinus rinses  Call for problems COVID test is negative

## 2022-09-06 NOTE — ED Provider Notes (Signed)
[name]    CSN: [id number] Arrival date & time: 09/06/22  1528      History   Chief Complaint Chief Complaint  Patient presents with   Headache    Sinus pressure and face pain - Entered by patient    HPI Michele Day is a 36 y.o. female.   HPI  Face pressure and pain.  Cough and PND.  congested nasal. passages.  Bad headache and body aches.  4 days.  Has not done covid testing.  Works as a [employer].  She does have underlying allergies.  She has pressure and pain in her cheeks/sinuses.  Feels "prone" to sinus infections.  Past Medical History:  Diagnosis Date   Anxiety    Depression    GERD (gastroesophageal reflux disease)    Hyperlipidemia    IBS (irritable bowel syndrome)    Migraines    Pseudotumor cerebri     There are no problems to display for this patient.   Past Surgical History:  Procedure Laterality Date   ABDOMINAL HYSTERECTOMY     CHOLECYSTECTOMY     TONSILLECTOMY     WISDOM TOOTH EXTRACTION      OB History   No obstetric history on file.      Home Medications    Prior to Admission medications   Medication Sig Start Date End Date Taking? Authorizing Provider  albuterol (VENTOLIN HFA) 108 (90 Base) MCG/ACT inhaler SMARTSIG:1-2 Puff(s) Via Inhaler Every 4 Hours PRN 08/24/22  Yes [provider]  cefdinir (OMNICEF) 300 MG capsule Take 1 capsule (300 mg total) by mouth 2 (two) times daily. 09/06/22  Yes Raylene Everts, MD  cyanocobalamin (VITAMIN B12) 1000 MCG/ML injection Inject 1,000 mcg into the skin every 30 (thirty) days. 08/24/22  Yes [provider]  EPINEPHrine 0.3 mg/0.3 mL IJ SOAJ injection  11/22/17  Yes [provider]  ezetimibe (ZETIA) 10 MG tablet Take 10 mg by mouth daily. 08/09/22  Yes [provider]  hydrOXYzine (ATARAX/VISTARIL) 10 MG tablet Take 10 mg by mouth 3 (three) times daily as needed.   Yes [provider]  Hyoscyamine Sulfate SL 0.125 MG SUBL Take by  mouth. 07/06/20  Yes [provider]  levothyroxine (SYNTHROID) 25 MCG tablet Take 25 mcg by mouth daily before breakfast.   Yes [provider]  methocarbamol (ROBAXIN) 500 MG tablet Take by mouth. 11/26/18  Yes [provider]  metoprolol succinate (TOPROL-XL) 25 MG 24 hr tablet Take 25 mg by mouth daily. 09/04/22  Yes [provider]  montelukast (SINGULAIR) 10 MG tablet Take 10 mg by mouth at bedtime.   Yes [provider]  predniSONE (DELTASONE) 20 MG tablet Take 1 tablet (20 mg total) by mouth 2 (two) times daily with a meal. 09/06/22  Yes Raylene Everts, MD  promethazine (PHENERGAN) 25 MG tablet Take 1 tablet (25 mg total) by mouth every 6 (six) hours as needed for nausea or vomiting. 04/07/16  Yes Caryl Ada K, PA-C  sertraline (ZOLOFT) 100 MG tablet Take 100 mg by mouth daily. Takes 200 mg   Yes [provider]  Vitamin D, Ergocalciferol, (DRISDOL) 1.25 MG (50000 UNIT) CAPS capsule Take by mouth. 11/22/17  Yes [provider]  bisacodyl (DULCOLAX) 5 MG EC tablet Take 10 mg by mouth once.    [provider]  carisoprodol (SOMA) 350 MG tablet Take 350 mg by mouth at bedtime. 05/01/17   [provider]  cyclobenzaprine (FLEXERIL) 10 MG tablet  Take 5 mg by mouth daily as needed for spasms. 05/02/17   [provider]  dicyclomine (BENTYL) 20 MG tablet Take 1 tablet (20 mg total) by mouth 2 (two) times daily. 11/26/17   Long, Arlyss Repress, MD  fluticasone (FLONASE) 50 MCG/ACT nasal spray Place 2 sprays into both nostrils daily. For at least 2 weeks, then daily as needed Patient not taking: Reported on 06/09/2017 06/22/16   Lurene Shadow, PA-C  furosemide (LASIX) 40 MG tablet Take 40 mg by mouth daily.     [provider]  gabapentin (NEURONTIN) 100 MG capsule Take 100 mg by mouth 2 (two) times daily. 100 mg in am, 100 mg noon. 06/02/17   [provider]  gabapentin (NEURONTIN) 300 MG capsule Take  300 mg by mouth at bedtime. 06/02/17   [provider]  HYDROcodone-acetaminophen (NORCO/VICODIN) 5-325 MG tablet Take 1 tablet by mouth daily as needed for pain. 05/22/17   [provider]  lamoTRIgine (LAMICTAL) 25 MG tablet Take 25 mg by mouth daily. 06/02/17   [provider]  LINZESS 145 MCG CAPS capsule Take 145 mcg by mouth daily. 06/05/17   [provider]  LORazepam (ATIVAN) 0.5 MG tablet Take 0.5 mg by mouth at bedtime.     [provider]  medroxyPROGESTERone (PROVERA) 10 MG tablet Take 10 mg by mouth as directed. Take 10 mg 15-25 of each month 05/17/17   [provider]  naproxen (NAPROSYN) 500 MG tablet Take 1 tablet (500 mg total) by mouth 2 (two) times daily. Patient not taking: Reported on 06/09/2017 06/22/16   Lurene Shadow, PA-C  omeprazole (PRILOSEC) 40 MG capsule Take 40 mg by mouth daily. 06/04/17   [provider]  ondansetron (ZOFRAN ODT) 4 MG disintegrating tablet Take 1 tablet (4 mg total) by mouth every 8 (eight) hours as needed for nausea or vomiting. 11/26/17   Long, Arlyss Repress, MD  polyethylene glycol powder (GLYCOLAX/MIRALAX) powder Take 17 g by mouth daily as needed for constipation. 06/05/17   [provider]  propranolol ER (INDERAL LA) 80 MG 24 hr capsule Take 80 mg by mouth daily. 06/07/17   [provider]  rizatriptan (MAXALT) 10 MG tablet Take 10 mg by mouth as needed for migraine. 05/19/16   [provider]  sucralfate (CARAFATE) 1 GM/10ML suspension Take 10 mLs (1 g total) by mouth 4 (four) times daily -  with meals and at bedtime. 11/26/17   Long, Arlyss Repress, MD  topiramate (TOPAMAX) 25 MG tablet Take 25 mg by mouth 2 (two) times daily.    [provider]  zolpidem (AMBIEN) 10 MG tablet Take 10 mg by mouth at bedtime. 06/02/17   [provider]    Family History Family History  Problem Relation Age of Onset   Hypertension Mother    Hypertension Father     Social  History Social History   Tobacco Use   Smoking status: Never   Smokeless tobacco: Never  Vaping Use   Vaping Use: Never used  Substance Use Topics   Alcohol use: No   Drug use: No     Allergies   Ciprofloxacin, Doxycycline, Duloxetine, and Penicillins   Review of Systems Review of Systems See HPI  Physical Exam Triage Vital Signs ED Triage Vitals  Enc Vitals Group     BP 09/06/22 1544 113/78     Pulse Rate 09/06/22 1544 91     Resp 09/06/22 1544 18     Temp  09/06/22 1544 98.5 F (36.9 C)     Temp Source 09/06/22 1544 Oral     SpO2 09/06/22 1544 95 %     Weight 09/06/22 1547 225 lb (102.1 kg)     Height 09/06/22 1547 5\' 10"  (1.778 m)     Head Circumference --      Peak Flow --      Pain Score 09/06/22 1547 6     Pain Loc --      Pain Edu? --      Excl. in GC? --    No data found.  Updated Vital Signs BP 113/78 (BP Location: Left Arm)   Pulse 91   Temp 98.5 F (36.9 C) (Oral)   Resp 18   Ht 5\' 10"  (1.778 m)   Wt 102.1 kg   LMP 06/09/2017   SpO2 95%   BMI 32.28 kg/m    Physical Exam Constitutional:      General: She is not in acute distress.    Appearance: She is well-developed. She is ill-appearing.  HENT:     Head: Normocephalic and atraumatic.     Right Ear: Tympanic membrane and ear canal normal.     Left Ear: Tympanic membrane and ear canal normal.     Nose: Rhinorrhea present.     Mouth/Throat:     Pharynx: Oropharynx is clear. Posterior oropharyngeal erythema present.  Eyes:     Conjunctiva/sclera: Conjunctivae normal.     Pupils: Pupils are equal, round, and reactive to light.  Cardiovascular:     Rate and Rhythm: Normal rate and regular rhythm.     Heart sounds: Normal heart sounds.  Pulmonary:     Effort: Pulmonary effort is normal. No respiratory distress.     Breath sounds: Normal breath sounds.  Abdominal:     General: There is no distension.     Palpations: Abdomen is soft.  Musculoskeletal:        General: Normal range of  motion.     Cervical back: Normal range of motion.  Lymphadenopathy:     Cervical: Cervical adenopathy present.  Skin:    General: Skin is warm and dry.  Neurological:     Mental Status: She is alert.  Psychiatric:        Mood and Affect: Mood normal.        Behavior: Behavior normal.      UC Treatments / Results  Labs (all labs ordered are listed, but only abnormal results are displayed) Labs Reviewed  POC SARS CORONAVIRUS 2 AG -  ED    EKG   Radiology No results found.  Procedures Procedures (including critical care time)  Medications Ordered in UC Medications - No data to display  Initial Impression / Assessment and Plan / UC Course  I have reviewed the triage vital signs and the nursing notes.  Pertinent labs & imaging results that were available during my care of the patient were reviewed by me and considered in my medical decision making (see chart for details).     Final Clinical Impressions(s) / UC Diagnoses   Final diagnoses:  Acute recurrent maxillary sinusitis  History of environmental allergies     Discharge Instructions      Take the antibiotic 2 times a day Take prednisone 2 times a day Drink to fluids Continue using your salt water or saline sinus rinses  Call for problems COVID test is negative     ED Prescriptions     Medication Sig Dispense  Auth. Provider   predniSONE (DELTASONE) 20 MG tablet Take 1 tablet (20 mg total) by mouth 2 (two) times daily with a meal. 10 tablet Eustace Moore, MD   cefdinir (OMNICEF) 300 MG capsule Take 1 capsule (300 mg total) by mouth 2 (two) times daily. 20 capsule Eustace Moore, MD      PDMP not reviewed this encounter.   Eustace Moore, MD 09/06/22 [phone]

## 2022-09-06 NOTE — ED Triage Notes (Signed)
Patient c/o possible sinus infection, facial pain across the bridge of her nose, nausea and feeling clammy x 4 days.  Patient has taken Tylenol, Ibuprofen, Methocarbamol and Phenergan.
